# Patient Record
Sex: Female | Born: 1945 | Race: White | Hispanic: No | Marital: Married | Smoking: Never smoker
Health system: Southern US, Community
[De-identification: ages and names within clinical notes are randomized; demographics above are authoritative.]

## PROBLEM LIST (undated history)

## (undated) DIAGNOSIS — G5760 Lesion of plantar nerve, unspecified lower limb: Secondary | ICD-10-CM

## (undated) DIAGNOSIS — Z9889 Other specified postprocedural states: Secondary | ICD-10-CM

## (undated) DIAGNOSIS — I10 Essential (primary) hypertension: Secondary | ICD-10-CM

## (undated) DIAGNOSIS — J329 Chronic sinusitis, unspecified: Secondary | ICD-10-CM

## (undated) DIAGNOSIS — I509 Heart failure, unspecified: Secondary | ICD-10-CM

## (undated) DIAGNOSIS — E119 Type 2 diabetes mellitus without complications: Secondary | ICD-10-CM

## (undated) DIAGNOSIS — C50919 Malignant neoplasm of unspecified site of unspecified female breast: Secondary | ICD-10-CM

## (undated) DIAGNOSIS — J45909 Unspecified asthma, uncomplicated: Secondary | ICD-10-CM

## (undated) DIAGNOSIS — E7800 Pure hypercholesterolemia, unspecified: Secondary | ICD-10-CM

## (undated) HISTORY — DX: Essential (primary) hypertension: I10

## (undated) HISTORY — DX: Pure hypercholesterolemia, unspecified: E78.00

## (undated) HISTORY — DX: Lesion of plantar nerve, unspecified lower limb: G57.60

## (undated) HISTORY — DX: Other specified postprocedural states: Z98.890

## (undated) HISTORY — DX: Malignant neoplasm of unspecified site of unspecified female breast: C50.919

## (undated) HISTORY — PX: RHINOPLASTY: SUR1284

## (undated) HISTORY — PX: TOTAL ABDOMINAL HYSTERECTOMY W/ BILATERAL SALPINGOOPHORECTOMY: SHX83

## (undated) HISTORY — DX: Chronic sinusitis, unspecified: J32.9

## (undated) HISTORY — PX: ABDOMINAL HYSTERECTOMY: SHX81

## (undated) HISTORY — DX: Unspecified asthma, uncomplicated: J45.909

---

## 1998-05-29 HISTORY — PX: FOOT NEUROMA SURGERY: SHX646

## 2010-05-29 HISTORY — PX: BREAST SURGERY: SHX581

## 2014-01-15 ENCOUNTER — Encounter (HOSPITAL_COMMUNITY): Payer: Medicare PPO | Attending: Hematology and Oncology

## 2014-01-15 ENCOUNTER — Encounter (HOSPITAL_COMMUNITY): Payer: Self-pay

## 2014-01-15 VITALS — BP 144/89 | HR 82 | Temp 97.7°F | Resp 18 | Wt 240.0 lb

## 2014-01-15 DIAGNOSIS — M949 Disorder of cartilage, unspecified: Secondary | ICD-10-CM

## 2014-01-15 DIAGNOSIS — K219 Gastro-esophageal reflux disease without esophagitis: Secondary | ICD-10-CM | POA: Insufficient documentation

## 2014-01-15 DIAGNOSIS — Z888 Allergy status to other drugs, medicaments and biological substances status: Secondary | ICD-10-CM | POA: Insufficient documentation

## 2014-01-15 DIAGNOSIS — Z170 Estrogen receptor positive status [ER+]: Secondary | ICD-10-CM | POA: Insufficient documentation

## 2014-01-15 DIAGNOSIS — M8580 Other specified disorders of bone density and structure, unspecified site: Secondary | ICD-10-CM | POA: Insufficient documentation

## 2014-01-15 DIAGNOSIS — Z9079 Acquired absence of other genital organ(s): Secondary | ICD-10-CM | POA: Insufficient documentation

## 2014-01-15 DIAGNOSIS — M899 Disorder of bone, unspecified: Secondary | ICD-10-CM | POA: Insufficient documentation

## 2014-01-15 DIAGNOSIS — Z79899 Other long term (current) drug therapy: Secondary | ICD-10-CM | POA: Insufficient documentation

## 2014-01-15 DIAGNOSIS — C50919 Malignant neoplasm of unspecified site of unspecified female breast: Secondary | ICD-10-CM | POA: Insufficient documentation

## 2014-01-15 DIAGNOSIS — Z809 Family history of malignant neoplasm, unspecified: Secondary | ICD-10-CM | POA: Insufficient documentation

## 2014-01-15 DIAGNOSIS — Z90710 Acquired absence of both cervix and uterus: Secondary | ICD-10-CM | POA: Insufficient documentation

## 2014-01-15 DIAGNOSIS — E785 Hyperlipidemia, unspecified: Secondary | ICD-10-CM | POA: Insufficient documentation

## 2014-01-15 DIAGNOSIS — E7800 Pure hypercholesterolemia, unspecified: Secondary | ICD-10-CM | POA: Insufficient documentation

## 2014-01-15 DIAGNOSIS — Z8249 Family history of ischemic heart disease and other diseases of the circulatory system: Secondary | ICD-10-CM | POA: Insufficient documentation

## 2014-01-15 DIAGNOSIS — Z882 Allergy status to sulfonamides status: Secondary | ICD-10-CM | POA: Insufficient documentation

## 2014-01-15 DIAGNOSIS — Z9889 Other specified postprocedural states: Secondary | ICD-10-CM | POA: Insufficient documentation

## 2014-01-15 DIAGNOSIS — G5760 Lesion of plantar nerve, unspecified lower limb: Secondary | ICD-10-CM | POA: Insufficient documentation

## 2014-01-15 DIAGNOSIS — I10 Essential (primary) hypertension: Secondary | ICD-10-CM | POA: Insufficient documentation

## 2014-01-15 HISTORY — DX: Malignant neoplasm of unspecified site of unspecified female breast: C50.919

## 2014-01-15 NOTE — Patient Instructions (Signed)
[place] Discharge Instructions  RECOMMENDATIONS MADE BY THE CONSULTANT AND ANY TEST RESULTS WILL BE SENT TO YOUR REFERRING PHYSICIAN.  Return in 6 months for lab work and office visit.  Thank you for choosing [facility] to provide your oncology and hematology care.  To afford each patient quality time with our providers, please arrive at least 15 minutes before your scheduled appointment time.  With your help, our goal is to use those 15 minutes to complete the necessary work-up to ensure our physicians have the information they need to help with your evaluation and healthcare recommendations.    Effective January 1st, 2014, we ask that you re-schedule your appointment with our physicians should you arrive 10 or more minutes late for your appointment.  We strive to give you quality time with our providers, and arriving late affects you and other patients whose appointments are after yours.    Again, thank you for choosing [facility].  Our hope is that these requests will decrease the amount of time that you wait before being seen by our physicians.       _____________________________________________________________  Should you have questions after your visit to [facility], please contact our office at [phone] between the hours of 8:30 a.m. and 4:30 p.m.  Voicemails left after 4:30 p.m. will not be returned until the following business day.  For prescription refill requests, have your pharmacy contact our office with your prescription refill request.    _______________________________________________________________  We hope that we have given you very good care.  You may receive a patient satisfaction survey in the mail, please complete it and return it as soon as possible.  We value your feedback!  _______________________________________________________________  Have you asked about our STAR program?  STAR stands for  Survivorship Training and Rehabilitation, and this is a nationally recognized cancer care program that focuses on survivorship and rehabilitation.  Cancer and cancer treatments may cause problems, such as, pain, making you feel tired and keeping you from doing the things that you need or want to do. Cancer rehabilitation can help. Our goal is to reduce these troubling effects and help you have the best quality of life possible.  You may receive a survey from a nurse that asks questions about your current state of health.  Based on the survey results, all eligible patients will be referred to the [facility] program for an evaluation so we can better serve you!  A frequently asked questions sheet is available upon request.

## 2014-01-15 NOTE — Progress Notes (Signed)
g       [place] A. Barnet Glasgow, M.D.  NEW PATIENT EVALUATION   Name: Catherine Stanley Date: 01/16/2014 MRN: [id number] DOB: 30-Apr-1946  PCP: [name], MD   REFERRING PHYSICIAN: [name], MD  REASON FOR REFERRAL: Followup of breast cancer, previously treated in [place].     HISTORY OF PRESENT ILLNESS:Catherine Stanley is a 68 y.o. female who is referred by her family physician for followup of breast cancer, originally treated in [place]. Path report is not available from her oncologist in [place] but hospital will be contacted for a copy of the path report. Apparently she had invasive cancer on the left and noninvasive disease on the right, status post bilateral lumpectomy with sentinel node biopsy on the left and bilateral external beam radiotherapy for an ER positive breast cancer. She was treated with letrozole which was started in August of 2013 with surgery on 09/06/2011. She moved here from [place] about 4 months ago with her husband. She denies any bilateral lymphedema and has had normal self breast examination. She denies any nasal drip, headache, skin rash, worsening joint pains but does have hot flashes but typically at night. She denies any vaginal dryness, incontinence, lower extremity swelling or redness, PND, orthopnea, palpitations, skin rash, headache, or seizures.   PAST MEDICAL HISTORY:  has a past medical history of Breast cancer; Hypertension; High cholesterol; Morton's neuroma; and H/O rhinoplasty.     PAST SURGICAL HISTORY: Past Surgical History  Procedure Laterality Date  . Abdominal hysterectomy    . Total abdominal hysterectomy w/ bilateral salpingoophorectomy    . Rhinoplasty      fx nasal septum     CURRENT MEDICATIONS: has a current medication list which includes the following prescription(s): alendronate, calcium carbonate, colesevelam, furosemide, glucosamine-chondroitin, lansoprazole, letrozole, multivitamin with  minerals, potassium chloride, and verapamil hcl.   ALLERGIES: Sulfa antibiotics; Aspirin; and Other   SOCIAL HISTORY:  reports that she has never smoked. She does not have any smokeless tobacco history on file. She reports that she drinks about .6 ounces of alcohol per week. She reports that she does not use illicit drugs. 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   FAMILY HISTORY: family history includes Cancer in her father, maternal aunt, mother, and sister; Diabetes in her mother; Hypertension in her mother.    REVIEW OF SYSTEMS:  Other than that discussed above is noncontributory.    PHYSICAL EXAM:  weight is 240 lb (108.863 kg). Her oral temperature is 97.7 F (36.5 C). Her blood pressure is 144/89 and her pulse is 82. Her respiration is 18.    GENERAL:alert, no distress and comfortable. Morbidly obese. SKIN: skin color, texture, turgor are normal, no rashes or significant lesions EYES: normal, Conjunctiva are pink and non-injected, sclera clear OROPHARYNX:no exudate, no erythema and lips, buccal mucosa, and tongue normal  NECK: supple, thyroid normal size, non-tender, without nodularity CHEST: Status post bilateral breast lumpectomies with no masses. LYMPH:  no palpable lymphadenopathy in the cervical, axillary or inguinal LUNGS: clear to auscultation and percussion with normal breathing effort HEART: regular rate & rhythm and no murmurs ABDOMEN:abdomen soft, non-tender and normal bowel sounds MUSCULOSKELETALl:no cyanosis of digits, no clubbing or edema  NEURO: alert & oriented x 3 with fluent speech, no focal motor/sensory deficits    LABORATORY DATA:  Office Visit on 01/15/2014  Component Date Value Ref Range Status  . WBC 01/16/2014 9.1  4.0 - 10.5 K/uL Final  .  RBC 01/16/2014 4.32  3.87 - 5.11 MIL/uL Final  . Hemoglobin 01/16/2014 14.4  12.0 - 15.0 g/dL Final  . HCT 01/16/2014 42.9  36.0 - 46.0 % Final  . MCV 01/16/2014  99.3  78.0 - 100.0 fL Final  . MCH 01/16/2014 33.3  26.0 - 34.0 pg Final  . MCHC 01/16/2014 33.6  30.0 - 36.0 g/dL Final  . RDW 01/16/2014 14.2  11.5 - 15.5 % Final  . Platelets 01/16/2014 232  150 - 400 K/uL Final  . Neutrophils Relative % 01/16/2014 65  43 - 77 % Final  . Neutro Abs 01/16/2014 5.9  1.7 - 7.7 K/uL Final  . Lymphocytes Relative 01/16/2014 26  12 - 46 % Final  . Lymphs Abs 01/16/2014 2.4  0.7 - 4.0 K/uL Final  . Monocytes Relative 01/16/2014 7  3 - 12 % Final  . Monocytes Absolute 01/16/2014 0.6  0.1 - 1.0 K/uL Final  . Eosinophils Relative 01/16/2014 2  0 - 5 % Final  . Eosinophils Absolute 01/16/2014 0.2  0.0 - 0.7 K/uL Final  . Basophils Relative 01/16/2014 0  0 - 1 % Final  . Basophils Absolute 01/16/2014 0.0  0.0 - 0.1 K/uL Final  . Sodium 01/16/2014 140  137 - 147 mEq/L Final  . Potassium 01/16/2014 4.0  3.7 - 5.3 mEq/L Final  . Chloride 01/16/2014 100  96 - 112 mEq/L Final  . CO2 01/16/2014 25  19 - 32 mEq/L Final  . Glucose, Bld 01/16/2014 199* 70 - 99 mg/dL Final  . BUN 01/16/2014 10  6 - 23 mg/dL Final  . Creatinine, Ser 01/16/2014 0.58  0.50 - 1.10 mg/dL Final  . Calcium 01/16/2014 9.2  8.4 - 10.5 mg/dL Final  . Total Protein 01/16/2014 7.2  6.0 - 8.3 g/dL Final  . Albumin 01/16/2014 3.3* 3.5 - 5.2 g/dL Final  . AST 01/16/2014 44* 0 - 37 U/L Final  . ALT 01/16/2014 45* 0 - 35 U/L Final  . Alkaline Phosphatase 01/16/2014 103  39 - 117 U/L Final  . Total Bilirubin 01/16/2014 0.9  0.3 - 1.2 mg/dL Final  . GFR calc non Af Amer 01/16/2014 >90  >90 mL/min Final  . GFR calc Af Amer 01/16/2014 >90  >90 mL/min Final   Comment: (NOTE)                          The eGFR has been calculated using the CKD EPI equation.                          This calculation has not been validated in all clinical situations.                          eGFR's persistently <90 mL/min signify possible Chronic Kidney                          Disease.  . Anion gap 01/16/2014 15  5 - 15  Final    Urinalysis No results found for this basename: colorurine,  appearanceur,  labspec,  phurine,  glucoseu,  hgbur,  bilirubinur,  ketonesur,  proteinur,  urobilinogen,  nitrite,  leukocytesur      @RADIOGRAPHY : No results found.  PATHOLOGY:  08/31/2011: MRI guided right breast biopsy consistent with focal lobular carcinoma in situ. 09/06/2011: Bilateral breast lumpectomy with sentinel node biopsy revealing  invasive lobular carcinoma in the left breast, size unknown, negative lymph nodes, Oncotype DX score of 11, ER/PR positive, HER-2/neu not overexpressed.  IMPRESSION:  #1. Invasive left breast cancer, status post lumpectomy and radiotherapy,  ER positive, tolerating letrozole well since started in August 2013, surgery performed on 09/06/2011. #2. 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 #3. Osteopenia, on treatment. #4. Gastroesophageal reflux disease, on treatment. #5. Hypertension, controlled. #6. Hyperlipidemia   PLAN:  #1. Continue letrozole 2.5 mg daily.  #2. Continue current medications. #3. Bilateral diagnostic mammography plus DEXA scan. Patient does have CTs of her previous MRIs of the breast and mammograms. #4. Followup in 6 months with CBC, chem profile, CEA, CA 27-29, and vitamin D level.    Doroteo Bradford, MD 01/16/2014 10:58 AM   DISCLAIMER:  This note was dictated with voice recognition softwre.  Similar sounding words can inadvertently be transcribed inaccurately and may not be corrected upon review.

## 2014-01-16 ENCOUNTER — Encounter (HOSPITAL_BASED_OUTPATIENT_CLINIC_OR_DEPARTMENT_OTHER): Payer: Medicare PPO

## 2014-01-16 DIAGNOSIS — Z79899 Other long term (current) drug therapy: Secondary | ICD-10-CM | POA: Diagnosis not present

## 2014-01-16 DIAGNOSIS — I10 Essential (primary) hypertension: Secondary | ICD-10-CM | POA: Diagnosis not present

## 2014-01-16 DIAGNOSIS — M949 Disorder of cartilage, unspecified: Secondary | ICD-10-CM | POA: Diagnosis not present

## 2014-01-16 DIAGNOSIS — Z888 Allergy status to other drugs, medicaments and biological substances status: Secondary | ICD-10-CM | POA: Diagnosis not present

## 2014-01-16 DIAGNOSIS — Z809 Family history of malignant neoplasm, unspecified: Secondary | ICD-10-CM | POA: Diagnosis not present

## 2014-01-16 DIAGNOSIS — Z882 Allergy status to sulfonamides status: Secondary | ICD-10-CM | POA: Diagnosis not present

## 2014-01-16 DIAGNOSIS — Z90710 Acquired absence of both cervix and uterus: Secondary | ICD-10-CM | POA: Diagnosis not present

## 2014-01-16 DIAGNOSIS — C50919 Malignant neoplasm of unspecified site of unspecified female breast: Secondary | ICD-10-CM | POA: Diagnosis present

## 2014-01-16 DIAGNOSIS — E7800 Pure hypercholesterolemia, unspecified: Secondary | ICD-10-CM | POA: Diagnosis not present

## 2014-01-16 DIAGNOSIS — G5760 Lesion of plantar nerve, unspecified lower limb: Secondary | ICD-10-CM | POA: Diagnosis not present

## 2014-01-16 DIAGNOSIS — Z9079 Acquired absence of other genital organ(s): Secondary | ICD-10-CM | POA: Diagnosis not present

## 2014-01-16 DIAGNOSIS — Z8249 Family history of ischemic heart disease and other diseases of the circulatory system: Secondary | ICD-10-CM | POA: Diagnosis not present

## 2014-01-16 DIAGNOSIS — Z9889 Other specified postprocedural states: Secondary | ICD-10-CM | POA: Diagnosis not present

## 2014-01-16 DIAGNOSIS — Z170 Estrogen receptor positive status [ER+]: Secondary | ICD-10-CM | POA: Diagnosis not present

## 2014-01-16 DIAGNOSIS — E785 Hyperlipidemia, unspecified: Secondary | ICD-10-CM | POA: Diagnosis not present

## 2014-01-16 DIAGNOSIS — K219 Gastro-esophageal reflux disease without esophagitis: Secondary | ICD-10-CM | POA: Diagnosis not present

## 2014-01-16 DIAGNOSIS — M899 Disorder of bone, unspecified: Secondary | ICD-10-CM | POA: Diagnosis not present

## 2014-01-16 LAB — COMPREHENSIVE METABOLIC PANEL
ALK PHOS: 103 U/L (ref 39–117)
ALT: 45 U/L — AB (ref 0–35)
AST: 44 U/L — ABNORMAL HIGH (ref 0–37)
Albumin: 3.3 g/dL — ABNORMAL LOW (ref 3.5–5.2)
Anion gap: 15 (ref 5–15)
BUN: 10 mg/dL (ref 6–23)
CO2: 25 meq/L (ref 19–32)
Calcium: 9.2 mg/dL (ref 8.4–10.5)
Chloride: 100 mEq/L (ref 96–112)
Creatinine, Ser: 0.58 mg/dL (ref 0.50–1.10)
GFR calc Af Amer: >90 mL/min (ref >90)
GLUCOSE: 199 mg/dL — AB (ref 70–99)
POTASSIUM: 4 meq/L (ref 3.7–5.3)
SODIUM: 140 meq/L (ref 137–147)
Total Bilirubin: 0.9 mg/dL (ref 0.3–1.2)
Total Protein: 7.2 g/dL (ref 6.0–8.3)

## 2014-01-16 LAB — CBC WITH DIFFERENTIAL/PLATELET
Basophils Absolute: 0 10*3/uL (ref 0.0–0.1)
Basophils Relative: 0 % (ref 0–1)
Eosinophils Absolute: 0.2 10*3/uL (ref 0.0–0.7)
Eosinophils Relative: 2 % (ref 0–5)
HCT: 42.9 % (ref 36.0–46.0)
HEMOGLOBIN: 14.4 g/dL (ref 12.0–15.0)
Lymphocytes Relative: 26 % (ref 12–46)
Lymphs Abs: 2.4 10*3/uL (ref 0.7–4.0)
MCH: 33.3 pg (ref 26.0–34.0)
MCHC: 33.6 g/dL (ref 30.0–36.0)
MCV: 99.3 fL (ref 78.0–100.0)
MONOS PCT: 7 % (ref 3–12)
Monocytes Absolute: 0.6 10*3/uL (ref 0.1–1.0)
NEUTROS ABS: 5.9 10*3/uL (ref 1.7–7.7)
NEUTROS PCT: 65 % (ref 43–77)
Platelets: 232 10*3/uL (ref 150–400)
RBC: 4.32 MIL/uL (ref 3.87–5.11)
RDW: 14.2 % (ref 11.5–15.5)
WBC: 9.1 10*3/uL (ref 4.0–10.5)

## 2014-01-16 NOTE — Progress Notes (Signed)
LABS DRAWN FOR [id number],CBCD,CEA,CMP,VD25

## 2014-01-17 LAB — CEA: CEA: 1.1 ng/mL (ref 0.0–5.0)

## 2014-01-17 LAB — CANCER ANTIGEN 27.29: CA 27.29: 41 U/mL — AB (ref 0–39)

## 2014-01-17 LAB — VITAMIN D 25 HYDROXY (VIT D DEFICIENCY, FRACTURES): Vit D, 25-Hydroxy: 56 ng/mL (ref 30–89)

## 2014-01-28 ENCOUNTER — Ambulatory Visit (HOSPITAL_COMMUNITY)
Admission: RE | Admit: 2014-01-28 | Discharge: 2014-01-28 | Disposition: A | Discharge status: Home / Self Care | Payer: Medicare PPO | Source: Ambulatory Visit | Attending: Hematology and Oncology | Admitting: Hematology and Oncology

## 2014-01-28 DIAGNOSIS — M899 Disorder of bone, unspecified: Secondary | ICD-10-CM | POA: Diagnosis not present

## 2014-01-28 DIAGNOSIS — M8580 Other specified disorders of bone density and structure, unspecified site: Secondary | ICD-10-CM

## 2014-01-28 DIAGNOSIS — M949 Disorder of cartilage, unspecified: Principal | ICD-10-CM

## 2014-02-03 ENCOUNTER — Encounter (HOSPITAL_COMMUNITY): Payer: Medicare PPO

## 2014-02-17 ENCOUNTER — Other Ambulatory Visit (HOSPITAL_COMMUNITY): Payer: Self-pay | Admitting: Hematology and Oncology

## 2014-02-17 ENCOUNTER — Ambulatory Visit (HOSPITAL_COMMUNITY)
Admission: RE | Admit: 2014-02-17 | Discharge: 2014-02-17 | Disposition: A | Discharge status: Home / Self Care | Payer: Medicare PPO | Source: Ambulatory Visit | Attending: Hematology and Oncology | Admitting: Hematology and Oncology

## 2014-02-17 DIAGNOSIS — C50919 Malignant neoplasm of unspecified site of unspecified female breast: Secondary | ICD-10-CM | POA: Diagnosis not present

## 2014-02-17 DIAGNOSIS — Z9889 Other specified postprocedural states: Secondary | ICD-10-CM | POA: Diagnosis not present

## 2014-06-15 ENCOUNTER — Other Ambulatory Visit (HOSPITAL_COMMUNITY): Payer: Self-pay | Admitting: Internal Medicine

## 2014-06-15 DIAGNOSIS — J339 Nasal polyp, unspecified: Secondary | ICD-10-CM

## 2014-06-18 ENCOUNTER — Ambulatory Visit (HOSPITAL_COMMUNITY)
Admission: RE | Admit: 2014-06-18 | Discharge: 2014-06-18 | Disposition: A | Discharge status: Home / Self Care | Payer: PPO | Source: Ambulatory Visit | Attending: Internal Medicine | Admitting: Internal Medicine

## 2014-06-18 DIAGNOSIS — J339 Nasal polyp, unspecified: Secondary | ICD-10-CM | POA: Diagnosis present

## 2014-07-15 ENCOUNTER — Ambulatory Visit (HOSPITAL_COMMUNITY): Payer: PPO | Admitting: Hematology & Oncology

## 2014-07-15 ENCOUNTER — Other Ambulatory Visit (HOSPITAL_COMMUNITY): Payer: Medicare PPO

## 2014-07-16 ENCOUNTER — Other Ambulatory Visit (HOSPITAL_COMMUNITY): Payer: Self-pay | Admitting: Hematology & Oncology

## 2014-07-16 DIAGNOSIS — Z09 Encounter for follow-up examination after completed treatment for conditions other than malignant neoplasm: Secondary | ICD-10-CM

## 2014-07-28 ENCOUNTER — Other Ambulatory Visit (HOSPITAL_COMMUNITY): Payer: Self-pay

## 2014-07-28 DIAGNOSIS — C50919 Malignant neoplasm of unspecified site of unspecified female breast: Secondary | ICD-10-CM

## 2014-07-28 DIAGNOSIS — M8580 Other specified disorders of bone density and structure, unspecified site: Secondary | ICD-10-CM

## 2014-07-30 ENCOUNTER — Encounter (HOSPITAL_COMMUNITY): Payer: PPO | Attending: Hematology & Oncology

## 2014-07-30 DIAGNOSIS — D72829 Elevated white blood cell count, unspecified: Secondary | ICD-10-CM | POA: Insufficient documentation

## 2014-07-30 DIAGNOSIS — M8580 Other specified disorders of bone density and structure, unspecified site: Secondary | ICD-10-CM | POA: Insufficient documentation

## 2014-07-30 DIAGNOSIS — C50919 Malignant neoplasm of unspecified site of unspecified female breast: Secondary | ICD-10-CM | POA: Diagnosis present

## 2014-07-30 DIAGNOSIS — Z853 Personal history of malignant neoplasm of breast: Secondary | ICD-10-CM

## 2014-07-30 LAB — COMPREHENSIVE METABOLIC PANEL
ALT: 45 U/L — AB (ref 0–35)
AST: 32 U/L (ref 0–37)
Albumin: 3.9 g/dL (ref 3.5–5.2)
Alkaline Phosphatase: 84 U/L (ref 39–117)
Anion gap: 8 (ref 5–15)
BILIRUBIN TOTAL: 0.8 mg/dL (ref 0.3–1.2)
BUN: 15 mg/dL (ref 6–23)
CO2: 26 mmol/L (ref 19–32)
CREATININE: 0.61 mg/dL (ref 0.50–1.10)
Calcium: 9.2 mg/dL (ref 8.4–10.5)
Chloride: 106 mmol/L (ref 96–112)
GFR calc Af Amer: >90 mL/min (ref >90)
GFR calc non Af Amer: >90 mL/min (ref >90)
Glucose, Bld: 171 mg/dL — ABNORMAL HIGH (ref 70–99)
Potassium: 3.9 mmol/L (ref 3.5–5.1)
Sodium: 140 mmol/L (ref 135–145)
Total Protein: 7.3 g/dL (ref 6.0–8.3)

## 2014-07-30 LAB — CBC WITH DIFFERENTIAL/PLATELET
Basophils Absolute: 0 10*3/uL (ref 0.0–0.1)
Basophils Relative: 0 % (ref 0–1)
EOS ABS: 0 10*3/uL (ref 0.0–0.7)
EOS PCT: 0 % (ref 0–5)
HEMATOCRIT: 43.5 % (ref 36.0–46.0)
HEMOGLOBIN: 14.5 g/dL (ref 12.0–15.0)
Lymphocytes Relative: 15 % (ref 12–46)
Lymphs Abs: 2.4 10*3/uL (ref 0.7–4.0)
MCH: 31.9 pg (ref 26.0–34.0)
MCHC: 33.3 g/dL (ref 30.0–36.0)
MCV: 95.8 fL (ref 78.0–100.0)
MONO ABS: 0.8 10*3/uL (ref 0.1–1.0)
MONOS PCT: 5 % (ref 3–12)
NEUTROS ABS: 13.1 10*3/uL — AB (ref 1.7–7.7)
Neutrophils Relative %: 80 % — ABNORMAL HIGH (ref 43–77)
Platelets: 267 10*3/uL (ref 150–400)
RBC: 4.54 MIL/uL (ref 3.87–5.11)
RDW: 12.4 % (ref 11.5–15.5)
WBC: 16.3 10*3/uL — ABNORMAL HIGH (ref 4.0–10.5)

## 2014-07-30 NOTE — Progress Notes (Signed)
LABS FOR VD25,[id number],CBCD,CMP

## 2014-07-31 ENCOUNTER — Encounter (HOSPITAL_COMMUNITY): Payer: Self-pay | Admitting: Oncology

## 2014-07-31 ENCOUNTER — Encounter (HOSPITAL_BASED_OUTPATIENT_CLINIC_OR_DEPARTMENT_OTHER): Payer: PPO | Admitting: Oncology

## 2014-07-31 VITALS — BP 127/72 | HR 69 | Temp 97.6°F | Resp 18 | Wt 266.3 lb

## 2014-07-31 DIAGNOSIS — D72829 Elevated white blood cell count, unspecified: Secondary | ICD-10-CM

## 2014-07-31 DIAGNOSIS — Z79811 Long term (current) use of aromatase inhibitors: Secondary | ICD-10-CM

## 2014-07-31 DIAGNOSIS — Z170 Estrogen receptor positive status [ER+]: Secondary | ICD-10-CM

## 2014-07-31 DIAGNOSIS — C50919 Malignant neoplasm of unspecified site of unspecified female breast: Secondary | ICD-10-CM

## 2014-07-31 DIAGNOSIS — C50912 Malignant neoplasm of unspecified site of left female breast: Secondary | ICD-10-CM

## 2014-07-31 DIAGNOSIS — M8580 Other specified disorders of bone density and structure, unspecified site: Secondary | ICD-10-CM

## 2014-07-31 LAB — VITAMIN D 25 HYDROXY (VIT D DEFICIENCY, FRACTURES): Vit D, 25-Hydroxy: 31.7 ng/mL (ref 30.0–100.0)

## 2014-07-31 LAB — CANCER ANTIGEN 27.29: CA 27.29: 38.4 U/mL (ref 0.0–38.6)

## 2014-07-31 NOTE — Assessment & Plan Note (Addendum)
New.  Likely secondary from Prednisone, which she is currently on. Will monitor.  Repeat CBC in 6 months.

## 2014-07-31 NOTE — Progress Notes (Signed)
Catherine Cahill, MD  [place] [address]  Primary invasive malignant neoplasm of female breast, unspecified laterality - Plan: CBC with Differential, Comprehensive metabolic panel  Osteopenia  Leukocytosis  CURRENT THERAPY: Letrozole beginning in August 2013 in [place].  INTERVAL HISTORY: Catherine Stanley 69 y.o. 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 She was treated with letrozole which was started in August of 2013 with surgery on 09/06/2011.   I personally reviewed and went over laboratory results with the patient.  The results are noted within this dictation.  I personally reviewed and went over radiographic studies with the patient.  The results are noted within this dictation.  She underwent bilateral US of breasts, ordered by Dr. Barnet Glasgow, on 02/17/2014.  It was recommended for bilateral diagnostic mammogram 6 months later and this is scheduled for April 2016.   She reports that in Jan 2016, she was diagnosed with a sinus infection.  This was treated by her primary care provider.  Part of the treatment included a course of Prednisone.  Subsequently, she was seen by Dr. Benjamine Mola (ENT) who noted a repeat sinus infection with polyps.  She was therefore restarted on Prednisone.  She is currently on Prednisone.  This is likely the cause of her leukocytosis noted on recent labs.  Oncologically, she denies any complaints and ROS questioning is negative.  Past Medical History  Diagnosis Date  . Breast cancer   . Hypertension   . High cholesterol   . Morton's neuroma   . H/O rhinoplasty   . Invasive lobular carcinoma of left breast 01/15/2014    In [place]: 08/31/2011: MRI guided right breast biopsy consistent with focal lobular  carcinoma in situ. 09/06/2011: Bilateral breast lumpectomy with sentinel node biopsy revealing invasive lobular carcinoma in the left breast, size unknown, negative lymph nodes, Oncotype DX score of 11, ER/PR positive, HER-2/neu not overexpressed.   . Asthma in adult   . Sinus infection     has Invasive lobular carcinoma of left breast; Hypertension; Osteopenia; Gastroesophageal reflux disease; Hyperlipidemia; and Leukocytosis on her problem list.     is allergic to sulfa antibiotics; aspirin; other; and seldane.  Catherine Stanley does not currently have medications on file.  Past Surgical History  Procedure Laterality Date  . Abdominal hysterectomy    . Total abdominal hysterectomy w/ bilateral salpingoophorectomy    . Rhinoplasty      fx nasal septum    Denies any headaches, dizziness, double vision, fevers, chills, night sweats, nausea, vomiting, diarrhea, constipation, chest pain, heart palpitations, shortness of breath, blood in stool, black tarry stool, urinary pain, urinary burning, urinary frequency, hematuria.   PHYSICAL EXAMINATION  ECOG PERFORMANCE STATUS: 0 - Asymptomatic  Filed Vitals:   07/31/14 1142  BP: 127/72  Pulse: 69  Temp: 97.6 F (36.4 C)  Resp: 18    GENERAL:alert, no distress, well nourished, well developed, comfortable, cooperative, obese and smiling, accompanied by her husband. SKIN: skin color, texture, turgor are normal, no rashes or significant lesions HEAD: Normocephalic, No masses, lesions, tenderness or abnormalities EYES: normal, PERRLA, EOMI, Conjunctiva are pink and non-injected EARS: External ears normal OROPHARYNX:lips, buccal mucosa, and tongue normal and mucous membranes are moist  NECK: supple, no adenopathy, thyroid normal  size, non-tender, without nodularity, no stridor, non-tender, trachea midline LYMPH:  no palpable lymphadenopathy, no hepatosplenomegaly BREAST:bilateral post-lumpectomy sites well healed and free of suspicious  changes LUNGS: clear to auscultation and percussion HEART: regular rate & rhythm, no murmurs, no gallops, S1 normal and S2 normal ABDOMEN:abdomen soft, non-tender, obese, normal bowel sounds and no masses or organomegaly BACK: Back symmetric, no curvature. EXTREMITIES:less then 2 second capillary refill, no joint deformities, effusion, or inflammation, no skin discoloration, no clubbing, no cyanosis  NEURO: alert & oriented x 3 with fluent speech, no focal motor/sensory deficits, gait normal   LABORATORY DATA: CBC    Component Value Date/Time   WBC 16.3* 07/30/2014 1150   RBC 4.54 07/30/2014 1150   HGB 14.5 07/30/2014 1150   HCT 43.5 07/30/2014 1150   PLT 267 07/30/2014 1150   MCV 95.8 07/30/2014 1150   MCH 31.9 07/30/2014 1150   MCHC 33.3 07/30/2014 1150   RDW 12.4 07/30/2014 1150   LYMPHSABS 2.4 07/30/2014 1150   MONOABS 0.8 07/30/2014 1150   EOSABS 0.0 07/30/2014 1150   BASOSABS 0.0 07/30/2014 1150      Chemistry      Component Value Date/Time   NA 140 07/30/2014 1150   K 3.9 07/30/2014 1150   CL 106 07/30/2014 1150   CO2 26 07/30/2014 1150   BUN 15 07/30/2014 1150   CREATININE 0.61 07/30/2014 1150      Component Value Date/Time   CALCIUM 9.2 07/30/2014 1150   ALKPHOS 84 07/30/2014 1150   AST 32 07/30/2014 1150   ALT 45* 07/30/2014 1150   BILITOT 0.8 07/30/2014 1150       RADIOGRAPHIC STUDIES:  02/17/2014   CLINICAL DATA: 69 year old female with bilateral breast cancer in 2013, in which the patient describes DCIS in the right breast and invasive carcinoma in the left breast. Post bilateral lumpectomies, left breast sentinel node biopsy, and radiation therapy. The patient states she did have a biopsy of a a nodule in the right breast, and which she was told it was a small seroma related to her surgery.  The patient states she did have mammograms in 2014, however they are not available.  EXAM: DIGITAL DIAGNOSTIC BILATERAL MAMMOGRAM WITH  CAD  RIGHT BREAST ULTRASOUND  COMPARISON: Prior mammogram dated 03/29/2012. Films from 2014 were not made available.  ACR Breast Density Category b: There are scattered areas of fibroglandular density.  FINDINGS: There is density and distortion seen in both the upper-outer right breast and upper outer left breast compatible with postlumpectomy scarring. A spot compression tangential view of the lumpectomy site in the right breast was performed demonstrating three small nodules located posterior to the surgical site, with the more posterior nodule containing a biopsy clip, and with the 2 more anterior nodules each measuring approximately 5 mm.  A spot compression tangential view of the lumpectomy site in the left breast was performed demonstrating 2 areas of nodularity along the posterior and superior margin of the lumpectomy scar, with the more posterior oval circumscribed nodule measuring 8 mm and the more superior circumscribed oval nodule measuring 4 mm.  Mammographic images were processed with CAD.  Physical examination of the upper-outer right breast reveals a postsurgical scar at the approximate 10 to 11 o'clock position. No palpable masses. Physical examination of the upper-outer left breast reveals a postsurgical scar in the far upper outer posterior left breast with a mild area of generalized thickening subjacent to the surgical scar.  Targeted ultrasound of the right breast was performed  demonstrating an oval mixed echogenicity mass at 10 o'clock 5 cm from nipple measuring 1 x 0.7 x 1.2 cm. Biopsy clip is seen along the superior margin of this mass. No additional findings are seen in the upper-outer right breast.  Targeted ultrasound of the upper-outer left breast was performed demonstrating a postsurgical scar at 10 o'clock 9 cm from the nipple. No suspicious masses or abnormalities are seen near the surgical site in the upper-outer left  breast.  IMPRESSION: Probably benign findings in the bilateral breasts, likely postsurgical changes related to prior lumpectomies.  RECOMMENDATION: Six-month followup diagnostic mammography of the bilateral breasts is recommended to demonstrate stability of the probably benign findings in the bilateral breasts likely related to postsurgical change.  I have discussed the findings and recommendations with the patient. Results were also provided in writing at the conclusion of the visit. If applicable, a reminder letter will be sent to the patient regarding the next appointment.  BI-RADS CATEGORY 3: Probably benign.   Electronically Signed  By: Everlean Alstrom M.D.  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 She is now on letrozole beginning in August 2013. Bilateral diagnostic mammogram scheduled for April 2016.  Continue Letrozole daily.  Return in 6 months for follow-up.   Osteopenia On Calcium 600 mg and Fosamax weekly.  Will change calcium to a calcium 1200 mg and Vit D [phone] units daily.  She may need to supplement her regimen to reflect these increased doses of Ca++ and Vit D.  Continue Fosamax.  If bone density worsens with next bone density test, I would recommend/consider Prolia therapy.   Leukocytosis New.  Likely secondary from Prednisone, which she is currently on. Will monitor.  Repeat CBC in 6 months.    THERAPY PLAN:  NCCN guidelines recommends the following surveillance for invasive breast cancer:  A. History and Physical exam every 4-6 months for 5 years and then  every 12 months.  B. Mammography every 12 months  C. Women on Tamoxifen: annual gynecologic assessment every 12 months if uterus is present.  D. Women on aromatase inhibitor or who experience ovarian failure secondary to treatment should have monitoring of bone health with a bone mineral density determination at baseline and periodically thereafter.  E. Assess and encourage adherence to adjuvant endocrine therapy.  F. Evidence suggests that active lifestyle and achieving and maintaining an ideal body weight (20-25 BMI) may lead to optimal breast cancer outcomes.   All questions were answered. The patient knows to call the clinic with any problems, questions or concerns. We can certainly see the patient much sooner if necessary.  Patient and plan discussed with Dr. Ancil Linsey and she is in agreement with the aforementioned.   This note is electronically signed by: Robynn Pane 07/31/2014 12:28 PM

## 2014-07-31 NOTE — Assessment & Plan Note (Addendum)
On Calcium 600 mg and Fosamax weekly.  Will change calcium to a calcium 1200 mg and Vit D [phone] units daily.  She may need to supplement her regimen to reflect these increased doses of Ca++ and Vit D.  Continue Fosamax.  If bone density worsens with next bone density test, I would recommend/consider Prolia therapy.

## 2014-07-31 NOTE — Assessment & Plan Note (Signed)
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 She is now on letrozole beginning in August 2013. Bilateral diagnostic mammogram scheduled for April 2016.  Continue Letrozole daily.  Return in 6 months for follow-up.

## 2014-07-31 NOTE — Patient Instructions (Signed)
[place] at [facility] Discharge Instructions  RECOMMENDATIONS MADE BY THE CONSULTANT AND ANY TEST RESULTS WILL BE SENT TO YOUR REFERRING PHYSICIAN.  Exam and discussion by Robynn Pane , PA-C.  You are doing well. Report any new lumps, bone pain, shortness of breath or other symptoms. Need to make sure you are taking Calcium 1000 to 1200 mg daily and Vitamin D 800 to 1000 units daily to protect your bones.  Mammogram as scheduled Labs and office visit in 6 months.  Thank you for choosing [place] at [facility] to provide your oncology and hematology care.  To afford each patient quality time with our provider, please arrive at least 15 minutes before your scheduled appointment time.    You need to re-schedule your appointment should you arrive 10 or more minutes late.  We strive to give you quality time with our providers, and arriving late affects you and other patients whose appointments are after yours.  Also, if you no show three or more times for appointments you may be dismissed from the clinic at the providers discretion.     Again, thank you for choosing [facility].  Our hope is that these requests will decrease the amount of time that you wait before being seen by our physicians.       _____________________________________________________________  Should you have questions after your visit to [facility], please contact our office at [phone] between the hours of 8:30 a.m. and 4:30 p.m.  Voicemails left after 4:30 p.m. will not be returned until the following business day.  For prescription refill requests, have your pharmacy contact our office.

## 2014-08-18 ENCOUNTER — Other Ambulatory Visit (HOSPITAL_COMMUNITY): Payer: Self-pay | Admitting: Internal Medicine

## 2014-08-18 ENCOUNTER — Ambulatory Visit (HOSPITAL_COMMUNITY)
Admission: RE | Admit: 2014-08-18 | Discharge: 2014-08-18 | Disposition: A | Discharge status: Home / Self Care | Payer: PPO | Source: Ambulatory Visit | Attending: Internal Medicine | Admitting: Internal Medicine

## 2014-08-18 ENCOUNTER — Ambulatory Visit (HOSPITAL_COMMUNITY)
Admission: RE | Admit: 2014-08-18 | Discharge: 2014-08-18 | Disposition: A | Discharge status: Home / Self Care | Payer: PPO | Source: Ambulatory Visit | Attending: Hematology & Oncology | Admitting: Hematology & Oncology

## 2014-08-18 DIAGNOSIS — IMO0002 Reserved for concepts with insufficient information to code with codable children: Secondary | ICD-10-CM

## 2014-08-18 DIAGNOSIS — R229 Localized swelling, mass and lump, unspecified: Principal | ICD-10-CM

## 2014-08-18 DIAGNOSIS — N63 Unspecified lump in breast: Secondary | ICD-10-CM | POA: Diagnosis not present

## 2014-08-18 DIAGNOSIS — N6310 Unspecified lump in the right breast, unspecified quadrant: Secondary | ICD-10-CM

## 2014-08-18 DIAGNOSIS — Z09 Encounter for follow-up examination after completed treatment for conditions other than malignant neoplasm: Secondary | ICD-10-CM

## 2014-09-01 ENCOUNTER — Encounter (HOSPITAL_COMMUNITY): Payer: PPO

## 2014-09-10 ENCOUNTER — Ambulatory Visit (INDEPENDENT_AMBULATORY_CARE_PROVIDER_SITE_OTHER): Payer: PPO | Admitting: Otolaryngology

## 2014-09-10 DIAGNOSIS — J310 Chronic rhinitis: Secondary | ICD-10-CM | POA: Diagnosis not present

## 2014-09-10 DIAGNOSIS — J330 Polyp of nasal cavity: Secondary | ICD-10-CM

## 2014-09-15 ENCOUNTER — Other Ambulatory Visit: Payer: Self-pay | Admitting: Otolaryngology

## 2014-09-21 ENCOUNTER — Other Ambulatory Visit (HOSPITAL_COMMUNITY): Payer: Self-pay | Admitting: Hematology & Oncology

## 2014-09-21 DIAGNOSIS — C50919 Malignant neoplasm of unspecified site of unspecified female breast: Secondary | ICD-10-CM

## 2014-10-14 ENCOUNTER — Encounter (HOSPITAL_BASED_OUTPATIENT_CLINIC_OR_DEPARTMENT_OTHER): Payer: Self-pay | Admitting: *Deleted

## 2014-10-14 NOTE — Progress Notes (Signed)
Patient to come for labs, EKG on Friday 5/20

## 2014-10-16 ENCOUNTER — Other Ambulatory Visit: Payer: Self-pay

## 2014-10-16 ENCOUNTER — Encounter (HOSPITAL_BASED_OUTPATIENT_CLINIC_OR_DEPARTMENT_OTHER)
Admission: RE | Admit: 2014-10-16 | Discharge: 2014-10-16 | Disposition: A | Discharge status: Home / Self Care | Payer: PPO | Source: Ambulatory Visit | Attending: Otolaryngology | Admitting: Otolaryngology

## 2014-10-16 DIAGNOSIS — J322 Chronic ethmoidal sinusitis: Secondary | ICD-10-CM | POA: Diagnosis not present

## 2014-10-16 DIAGNOSIS — I10 Essential (primary) hypertension: Secondary | ICD-10-CM | POA: Diagnosis not present

## 2014-10-16 DIAGNOSIS — K219 Gastro-esophageal reflux disease without esophagitis: Secondary | ICD-10-CM | POA: Diagnosis not present

## 2014-10-16 DIAGNOSIS — E119 Type 2 diabetes mellitus without complications: Secondary | ICD-10-CM | POA: Diagnosis not present

## 2014-10-16 DIAGNOSIS — I509 Heart failure, unspecified: Secondary | ICD-10-CM | POA: Diagnosis not present

## 2014-10-16 DIAGNOSIS — J45909 Unspecified asthma, uncomplicated: Secondary | ICD-10-CM | POA: Diagnosis not present

## 2014-10-16 LAB — BASIC METABOLIC PANEL
ANION GAP: 10 (ref 5–15)
BUN: 7 mg/dL (ref 6–20)
CHLORIDE: 109 mmol/L (ref 101–111)
CO2: 23 mmol/L (ref 22–32)
Calcium: 9.5 mg/dL (ref 8.9–10.3)
Creatinine, Ser: 0.56 mg/dL (ref 0.44–1.00)
GFR calc non Af Amer: >60 mL/min (ref >60)
Glucose, Bld: 109 mg/dL — ABNORMAL HIGH (ref 65–99)
Potassium: 4.1 mmol/L (ref 3.5–5.1)
Sodium: 142 mmol/L (ref 135–145)

## 2014-10-19 ENCOUNTER — Ambulatory Visit (HOSPITAL_BASED_OUTPATIENT_CLINIC_OR_DEPARTMENT_OTHER)
Admission: RE | Admit: 2014-10-19 | Discharge: 2014-10-19 | Disposition: A | Discharge status: Home / Self Care | Payer: PPO | Source: Ambulatory Visit | Attending: Otolaryngology | Admitting: Otolaryngology

## 2014-10-19 ENCOUNTER — Ambulatory Visit (HOSPITAL_BASED_OUTPATIENT_CLINIC_OR_DEPARTMENT_OTHER): Payer: PPO | Admitting: Anesthesiology

## 2014-10-19 ENCOUNTER — Encounter (HOSPITAL_BASED_OUTPATIENT_CLINIC_OR_DEPARTMENT_OTHER): Payer: Self-pay | Admitting: Anesthesiology

## 2014-10-19 ENCOUNTER — Encounter (HOSPITAL_BASED_OUTPATIENT_CLINIC_OR_DEPARTMENT_OTHER)
Admission: RE | Disposition: A | Discharge status: Home / Self Care | Payer: Self-pay | Source: Ambulatory Visit | Attending: Otolaryngology

## 2014-10-19 DIAGNOSIS — E119 Type 2 diabetes mellitus without complications: Secondary | ICD-10-CM | POA: Insufficient documentation

## 2014-10-19 DIAGNOSIS — J322 Chronic ethmoidal sinusitis: Secondary | ICD-10-CM

## 2014-10-19 DIAGNOSIS — I509 Heart failure, unspecified: Secondary | ICD-10-CM | POA: Insufficient documentation

## 2014-10-19 DIAGNOSIS — I10 Essential (primary) hypertension: Secondary | ICD-10-CM | POA: Diagnosis not present

## 2014-10-19 DIAGNOSIS — J338 Other polyp of sinus: Secondary | ICD-10-CM | POA: Diagnosis not present

## 2014-10-19 DIAGNOSIS — J45909 Unspecified asthma, uncomplicated: Secondary | ICD-10-CM | POA: Diagnosis not present

## 2014-10-19 DIAGNOSIS — J323 Chronic sphenoidal sinusitis: Secondary | ICD-10-CM | POA: Diagnosis not present

## 2014-10-19 DIAGNOSIS — K219 Gastro-esophageal reflux disease without esophagitis: Secondary | ICD-10-CM | POA: Insufficient documentation

## 2014-10-19 DIAGNOSIS — J320 Chronic maxillary sinusitis: Secondary | ICD-10-CM | POA: Diagnosis not present

## 2014-10-19 HISTORY — PX: SINUS ENDO WITH FUSION: SHX5329

## 2014-10-19 HISTORY — DX: Heart failure, unspecified: I50.9

## 2014-10-19 HISTORY — DX: Type 2 diabetes mellitus without complications: E11.9

## 2014-10-19 LAB — GLUCOSE, CAPILLARY
GLUCOSE-CAPILLARY: 101 mg/dL — AB (ref 65–99)
Glucose-Capillary: 120 mg/dL — ABNORMAL HIGH (ref 65–99)

## 2014-10-19 SURGERY — SURGERY, PARANASAL SINUS, ENDOSCOPIC, WITH NASAL SEPTOPLASTY, TURBINOPLASTY, AND MAXILLARY SINUSOTOMY
Anesthesia: General | Laterality: Bilateral

## 2014-10-19 MED ORDER — MIDAZOLAM HCL 2 MG/2ML IJ SOLN
INTRAMUSCULAR | Status: AC
  Filled 2014-10-19: qty 2

## 2014-10-19 MED ORDER — LACTATED RINGERS IV SOLN
INTRAVENOUS | Status: DC
  Administered 2014-10-19: 09:00:00 via INTRAVENOUS

## 2014-10-19 MED ORDER — HYDROMORPHONE HCL 1 MG/ML IJ SOLN
INTRAMUSCULAR | Status: AC
  Filled 2014-10-19: qty 1

## 2014-10-19 MED ORDER — MUPIROCIN 2 % EX OINT
TOPICAL_OINTMENT | CUTANEOUS | Status: DC | PRN
  Administered 2014-10-19: 1 via NASAL

## 2014-10-19 MED ORDER — CEFAZOLIN SODIUM-DEXTROSE 2-3 GM-% IV SOLR
INTRAVENOUS | Status: AC
  Filled 2014-10-19: qty 50

## 2014-10-19 MED ORDER — SUCCINYLCHOLINE CHLORIDE 20 MG/ML IJ SOLN
INTRAMUSCULAR | Status: DC | PRN
  Administered 2014-10-19: 100 mg via INTRAVENOUS

## 2014-10-19 MED ORDER — LIDOCAINE HCL (CARDIAC) 20 MG/ML IV SOLN
INTRAVENOUS | Status: DC | PRN
  Administered 2014-10-19: 50 mg via INTRAVENOUS

## 2014-10-19 MED ORDER — LIDOCAINE-EPINEPHRINE 1 %-1:100000 IJ SOLN
INTRAMUSCULAR | Status: DC | PRN
  Administered 2014-10-19: 2 mL

## 2014-10-19 MED ORDER — OXYMETAZOLINE HCL 0.05 % NA SOLN
NASAL | Status: DC | PRN
  Administered 2014-10-19: 1 via NASAL

## 2014-10-19 MED ORDER — HYDROMORPHONE HCL 1 MG/ML IJ SOLN
0.2500 mg | INTRAMUSCULAR | Status: DC | PRN
  Administered 2014-10-19 (×2): 0.5 mg via INTRAVENOUS

## 2014-10-19 MED ORDER — ONDANSETRON HCL 4 MG/2ML IJ SOLN
INTRAMUSCULAR | Status: DC | PRN
  Administered 2014-10-19: 4 mg via INTRAVENOUS

## 2014-10-19 MED ORDER — OXYCODONE-ACETAMINOPHEN 5-325 MG PO TABS: 1.0000 | ORAL_TABLET | ORAL | Status: DC | PRN

## 2014-10-19 MED ORDER — SODIUM CHLORIDE 0.9 % IV SOLN: INTRAVENOUS | Status: DC

## 2014-10-19 MED ORDER — PROPOFOL 10 MG/ML IV BOLUS
INTRAVENOUS | Status: DC | PRN
  Administered 2014-10-19: 40 mg via INTRAVENOUS
  Administered 2014-10-19: 200 mg via INTRAVENOUS

## 2014-10-19 MED ORDER — PROMETHAZINE HCL 25 MG/ML IJ SOLN: 6.2500 mg | INTRAMUSCULAR | Status: DC | PRN

## 2014-10-19 MED ORDER — FENTANYL CITRATE (PF) 100 MCG/2ML IJ SOLN
INTRAMUSCULAR | Status: DC | PRN
  Administered 2014-10-19 (×2): 50 ug via INTRAVENOUS
  Administered 2014-10-19 (×2): 25 ug via INTRAVENOUS

## 2014-10-19 MED ORDER — AMOXICILLIN 875 MG PO TABS: 875.0000 mg | ORAL_TABLET | Freq: Two times a day (BID) | ORAL | Status: DC

## 2014-10-19 MED ORDER — CEFAZOLIN SODIUM-DEXTROSE 2-3 GM-% IV SOLR
INTRAVENOUS | Status: DC | PRN
  Administered 2014-10-19: 2 g via INTRAVENOUS

## 2014-10-19 MED ORDER — MIDAZOLAM HCL 5 MG/5ML IJ SOLN
INTRAMUSCULAR | Status: DC | PRN
  Administered 2014-10-19: 2 mg via INTRAVENOUS

## 2014-10-19 MED ORDER — FENTANYL CITRATE (PF) 100 MCG/2ML IJ SOLN
INTRAMUSCULAR | Status: AC
  Filled 2014-10-19: qty 4

## 2014-10-19 SURGICAL SUPPLY — 44 items
BLADE RAD60 ROTATE M4 4 5PK (BLADE) ×2 IMPLANT
BLADE ROTATE RAD 12 4 M4 (BLADE) IMPLANT
BLADE ROTATE RAD 40 4 M4 (BLADE) IMPLANT
BLADE ROTATE TRICUT 4X13 M4 (BLADE) IMPLANT
BLADE TRICUT ROTATE M4 4 5PK (BLADE) ×2 IMPLANT
BUR HS RAD FRONTAL 3 (BURR) IMPLANT
CANISTER SUC SOCK COL 7IN (MISCELLANEOUS) ×4 IMPLANT
CANISTER SUCT 1200ML W/VALVE (MISCELLANEOUS) ×2 IMPLANT
COAGULATOR SUCT 8FR VV (MISCELLANEOUS) IMPLANT
COAGULATOR SUCT SWTCH 10FR 6 (ELECTROSURGICAL) ×2 IMPLANT
DECANTER SPIKE VIAL GLASS SM (MISCELLANEOUS) IMPLANT
DRSG NASAL KENNEDY LMNT 8CM (GAUZE/BANDAGES/DRESSINGS) IMPLANT
DRSG NASOPORE 8CM (GAUZE/BANDAGES/DRESSINGS) ×2 IMPLANT
ELECT REM PT RETURN 9FT ADLT (ELECTROSURGICAL) ×2
ELECTRODE REM PT RTRN 9FT ADLT (ELECTROSURGICAL) ×1 IMPLANT
GLOVE BIO SURGEON STRL SZ7.5 (GLOVE) ×2 IMPLANT
GLOVE SURG SS PI 7.0 STRL IVOR (GLOVE) ×2 IMPLANT
GOWN STRL REUS W/ TWL LRG LVL3 (GOWN DISPOSABLE) ×2 IMPLANT
GOWN STRL REUS W/TWL LRG LVL3 (GOWN DISPOSABLE) ×2
HEMOSTAT SURGICEL 2X14 (HEMOSTASIS) IMPLANT
IV NS 1000ML (IV SOLUTION)
IV NS 1000ML BAXH (IV SOLUTION) IMPLANT
IV NS 500ML (IV SOLUTION) ×1
IV NS 500ML BAXH (IV SOLUTION) ×1 IMPLANT
NEEDLE PRECISIONGLIDE 27X1.5 (NEEDLE) ×2 IMPLANT
NEEDLE SPNL 25GX3.5 QUINCKE BL (NEEDLE) IMPLANT
NS IRRIG 1000ML POUR BTL (IV SOLUTION) ×2 IMPLANT
PACK BASIN DAY SURGERY FS (CUSTOM PROCEDURE TRAY) ×2 IMPLANT
PACK ENT DAY SURGERY (CUSTOM PROCEDURE TRAY) ×2 IMPLANT
PAD ENT ADHESIVE 25PK (MISCELLANEOUS) ×4 IMPLANT
SLEEVE SCD COMPRESS KNEE MED (MISCELLANEOUS) ×2 IMPLANT
SOLUTION BUTLER CLEAR DIP (MISCELLANEOUS) ×2 IMPLANT
SPLINT NASAL AIRWAY SILICONE (MISCELLANEOUS) IMPLANT
SPONGE GAUZE 2X2 8PLY STRL LF (GAUZE/BANDAGES/DRESSINGS) ×2 IMPLANT
SPONGE NEURO XRAY DETECT 1X3 (DISPOSABLE) ×2 IMPLANT
SUT ETHILON 3 0 PS 1 (SUTURE) IMPLANT
SUT PLAIN 4 0 ~~LOC~~ 1 (SUTURE) IMPLANT
TOWEL OR 17X24 6PK STRL BLUE (TOWEL DISPOSABLE) ×2 IMPLANT
TRACKER ENT INSTRUMENT (MISCELLANEOUS) IMPLANT
TRACKER ENT PATIENT (MISCELLANEOUS) IMPLANT
TUBE CONNECTING 20X1/4 (TUBING) ×2 IMPLANT
TUBE SALEM SUMP 16 FR W/ARV (TUBING) IMPLANT
TUBING STRAIGHTSHOT EPS 5PK (TUBING) IMPLANT
YANKAUER SUCT BULB TIP NO VENT (SUCTIONS) IMPLANT

## 2014-10-19 NOTE — Brief Op Note (Signed)
10/19/2014  11:23 AM  PATIENT:  Catherine Stanley  69 y.o. female  PRE-OPERATIVE DIAGNOSIS:  Chronic bilateral ethmoid, maxillary, sphenoid sinusitis and polyposis  POST-OPERATIVE DIAGNOSIS:  Chronic bilateral ethmoid, maxillary, sphenoid sinusitis and polyposis  PROCEDURE:  Procedure(s): 1) Bilateral endoscopic total ethmoidectomy 2) Bilateral endoscopic maxillary antrostomy with polyp removal 3) Bilateral endoscopic sphenoidectomy  SURGEON:  Surgeon(s) and Role:    * Leta Baptist, MD - Primary  PHYSICIAN ASSISTANT:   ASSISTANTS: none   ANESTHESIA:   general  EBL:  Total I/O In: 1500 [I.V.:1500] Out: -   BLOOD ADMINISTERED:none  DRAINS: none   LOCAL MEDICATIONS USED:  LIDOCAINE   SPECIMEN:  Source of Specimen:  Bilateral sinus contents  DISPOSITION OF SPECIMEN:  PATHOLOGY  COUNTS:  YES  TOURNIQUET:  * No tourniquets in log *  DICTATION: .Other Dictation: Dictation Number [id number]  PLAN OF CARE: Discharge to home after PACU  PATIENT DISPOSITION:  PACU - hemodynamically stable.   Delay start of Pharmacological VTE agent (>24hrs) due to surgical blood loss or risk of bleeding: not applicable

## 2014-10-19 NOTE — H&P (Signed)
Cc: Nasal polyposis/chronic sinusitis.  HPI: The patient is a 69 y/o female who returns today for follow up evaluation of recurrent sinusitis. She was last seen 6 weeks ago. At that time, she was noted to have bilateral nasal polyps without acute sinusitis. The patient was treated with daily Flonase and 12 day course of prednisone.  The patient returns today noting improvement in her nasal congestion and post nasal drainage. No facial pain or pressure is currently noted. The patient's sinus CT done in January showed polyps extending into both maxillary sinuses. Involvement of the ethmoid and sphenoid sinuses was also noted.  The patient has a previous history of bilateral nasal polyps and previously underwent septoplasty to treat her deviated septum. No other ENT, GI, or respiratory issue noted since the last visit.   Exam: The nasal cavities were decongested and anesthetised with a combination of oxymetazoline and 4% lidocaine solution.  The flexible scope was inserted into the right nasal cavity.  Endoscopy of the inferior and middle meatus was performed.  Edematous mucosa was noted. Polyps are noted along the middle meatus.   Nasopharynx was clear.  Turbinates were hypertrophied but without mass.  The procedure was repeated on the contralateral side with similar findings.  The patient tolerated the procedure well.  Instructions were given to avoid eating or drinking for 2 hours.    Assessment: 1.  Persistent bilateral nasal polyposis with involvement of both maxillary sinuses along with the sphenoid and left ethmoid.  Plan:  1.  Nasal endoscopy and CT findings are reviewed with the patient. 2.  Treatment options included continuing medical management versus surgical intervention. The patient will likely benefit from endoscopic sinus surgery to clear both maxillary, sphenoid, and ethmoid sinuses. The risks, benefits, details, and alternatives of the procedure are discussed with the patient. Questions  are invited and answered. 3.  The patient would like to proceed with the procedure. This will be scheduled in accordance to the patient's schedule. She will continue with daily Flonase in the interim.

## 2014-10-19 NOTE — Anesthesia Postprocedure Evaluation (Signed)
  Anesthesia Post-op Note  Patient: Catherine Stanley  Procedure(s) Performed: Procedure(s): BILATERAL ENDOSCOPIC TOTAL ETHMOIDECTOMY, BILATERAL ENDOSCOPIC MAXILLARY ANTHROSTOMY BILATERAL ENDOSCOPIC  SPHENOIDECTOMY  (Bilateral)  Patient Location: PACU  Anesthesia Type:General  Level of Consciousness: awake  Airway and Oxygen Therapy: Patient Spontanous Breathing  Post-op Pain: mild  Post-op Assessment: Post-op Vital signs reviewed  Post-op Vital Signs: Reviewed  Last Vitals:  Filed Vitals:   10/19/14 1130  BP:   Pulse: 87  Temp:   Resp: 16    Complications: No apparent anesthesia complications

## 2014-10-19 NOTE — Anesthesia Preprocedure Evaluation (Addendum)
Anesthesia Evaluation  Patient identified by MRN, date of birth, ID band Patient awake    Reviewed: Allergy & Precautions, NPO status , Patient's Chart, lab work & pertinent test results  Airway Mallampati: II  TM Distance: >3 FB Neck ROM: Full    Dental   Pulmonary asthma ,  breath sounds clear to auscultation        Cardiovascular hypertension, +CHF Rhythm:Regular Rate:Normal     Neuro/Psych    GI/Hepatic Neg liver ROS, GERD-  ,  Endo/Other  diabetes  Renal/GU negative Renal ROS     Musculoskeletal   Abdominal   Peds  Hematology   Anesthesia Other Findings   Reproductive/Obstetrics                            Anesthesia Physical Anesthesia Plan  ASA: III  Anesthesia Plan: General   Post-op Pain Management:    Induction: Intravenous  Airway Management Planned: Oral ETT  Additional Equipment:   Intra-op Plan:   Post-operative Plan: Extubation in OR  Informed Consent: I have reviewed the patients History and Physical, chart, labs and discussed the procedure including the risks, benefits and alternatives for the proposed anesthesia with the patient or authorized representative who has indicated his/her understanding and acceptance.   Dental advisory given  Plan Discussed with: CRNA and Anesthesiologist  Anesthesia Plan Comments:         Anesthesia Quick Evaluation

## 2014-10-19 NOTE — Discharge Instructions (Addendum)

## 2014-10-19 NOTE — Transfer of Care (Signed)
Immediate Anesthesia Transfer of Care Note  Patient: Catherine Stanley  Procedure(s) Performed: Procedure(s): BILATERAL ENDOSCOPIC TOTAL ETHMOIDECTOMY, BILATERAL ENDOSCOPIC MAXILLARY ANTHROSTOMY BILATERAL ENDOSCOPIC  SPHENOIDECTOMY  (Bilateral)  Patient Location: PACU  Anesthesia Type:General  Level of Consciousness: awake  Airway & Oxygen Therapy: Patient Spontanous Breathing and Patient connected to face mask oxygen  Post-op Assessment: Report given to RN and Post -op Vital signs reviewed and stable  Post vital signs: Reviewed and stable  Last Vitals:  Filed Vitals:   10/19/14 0809  BP: 122/74  Pulse: 73  Temp: 36.4 C  Resp: 18    Complications: No apparent anesthesia complications

## 2014-10-19 NOTE — Anesthesia Procedure Notes (Signed)
Procedure Name: Intubation Date/Time: 10/19/2014 9:44 AM Performed by: Lieutenant Diego Pre-anesthesia Checklist: Patient identified, Emergency Drugs available, Suction available and Patient being monitored Patient Re-evaluated:Patient Re-evaluated prior to inductionOxygen Delivery Method: Circle System Utilized Preoxygenation: Pre-oxygenation with 100% oxygen Intubation Type: IV induction Ventilation: Mask ventilation without difficulty Laryngoscope Size: Miller and 1 Grade View: Grade I Tube type: Oral Tube size: 7.0 mm Number of attempts: 1 Airway Equipment and Method: Stylet and Oral airway Placement Confirmation: ETT inserted through vocal cords under direct vision,  positive ETCO2 and breath sounds checked- equal and bilateral Secured at: 22 cm Tube secured with: Tape Dental Injury: Teeth and Oropharynx as per pre-operative assessment

## 2014-10-20 ENCOUNTER — Encounter (HOSPITAL_BASED_OUTPATIENT_CLINIC_OR_DEPARTMENT_OTHER): Payer: Self-pay | Admitting: Otolaryngology

## 2014-10-20 NOTE — Op Note (Signed)
Catherine Stanley, Catherine Stanley NO.:  [id number]  MEDICAL RECORD NO.:  [id number]  LOCATION:                                FACILITY:  [facility]  PHYSICIAN:  Leta Baptist, MD            DATE OF BIRTH:  [birth year]  DATE OF PROCEDURE:  10/19/2014 DATE OF DISCHARGE:  10/19/2014                              OPERATIVE REPORT   SURGEON:  Leta Baptist, MD  PREOPERATIVE DIAGNOSES: 1. Bilateral chronic maxillary, sphenoid, and ethmoid sinusitis. 2. Bilateral sinonasal polyps.  POSTOPERATIVE DIAGNOSES: 1. Bilateral chronic maxillary, sphenoid, and ethmoid sinusitis. 2. Bilateral sinonasal polyps.  PROCEDURE PERFORMED: 1. Bilateral endoscopic total ethmoidectomy. 2. Bilateral endoscopic maxillary antrostomy with polyp removal. 3. Bilateral endoscopic sphenoidectomy.  ANESTHESIA:  General endotracheal tube anesthesia.  COMPLICATIONS:  None.  ESTIMATED BLOOD LOSS:  Less than 100 mL.  INDICATION FOR PROCEDURE:  The patient is a 69 year old female with a history of bilateral recurrent sinusitis and sinonasal polyps.  She was previously treated with multiple courses of antibiotics, topical steroids, and systemic steroids.  She has also been treated with extensive allergy medications.  On examination, she was noted to have persistent bilateral sinonasal polyps.  Her CT showed mucosal thickening or acute changes within the maxillary, ethmoid, and sphenoid sinuses. Based on the above findings, the decision was made for the patient to undergo the above-stated procedures.  The risks, benefits, alternatives, and details of the procedures were discussed with the patient. Questions were invited and answered.  Informed consent was obtained.  DESCRIPTION OF PROCEDURE:  The patient was taken to the operating room and placed supine on the operating table.  General endotracheal tube anesthesia was administered by the anesthesiologist.  The patient was positioned and prepped and draped in a standard  fashion for sinonasal surgery.  Preop IV antibiotic was given.  Pledgets soaked with Afrin were placed in both nasal cavities for vasoconstriction.  The pledgets were subsequently removed.  Using a 0- degree scope, both nasal cavities were examined.  Large polyps were noted within both middle meatus.  Attention was first focused on the left side.  Lidocaine 1% with 1:100,000 epinephrine was infiltrated into the middle turbinate, lateral nasal wall, and polypoid tissue.  The polypoid tissue was then removed using a combination of Blakesley forceps, Tru-Cut forceps, and microdebrider.  A standard uncinectomy procedure was performed.  The maxillary antrum was entered and enlarged with a combination of backbiter, Tru-Cut forceps, and microdebrider. Polypoid tissue was removed from the antrum and from the maxillary sinus.  The anterior and the posterior ethmoid cavities were then entered.  The bony partitions of the ethmoid cavities were taken down.  Polypoid tissue was also removed.  Attention was then switched to the sphenoid sinus.  The entrance into the left sphenoid sinus was carefully enlarged with a combination of microdebrider and Tru-cut forceps.  No significant polypoid tissue was noted within the sphenoid sinus.  The surgical sites were copiously irrigated.  Hemostasis was achieved with a NasoPore packing.  Attention was then focused on the right side.  The same procedures were repeated without any exception.  It should be noted that the patient  has a large polyp within the right maxillary sinus.  All polypoid tissue was removed.  The care of the patient was turned over to the anesthesiologist.  The patient was awakened from anesthesia without difficulty.  She was extubated and transferred to the recovery room in good condition.  OPERATIVE FINDINGS:  Bilateral sinonasal polyps and chronic sinusitis.  SPECIMENS:  Bilateral sinus contents.  FOLLOWUP CARE:  The patient will  be discharged home once she is awake and alert.  She may take Percocet p.r.n. pain and amoxicillin p.o. b.i.d. for 5 days.  The patient will follow up in my office in 1 week.     Leta Baptist, MD     ST/MEDQ  D:  10/19/2014  T:  10/19/2014  Job:  [id number]  cc:   [name], M.D.

## 2014-10-29 ENCOUNTER — Ambulatory Visit (INDEPENDENT_AMBULATORY_CARE_PROVIDER_SITE_OTHER): Payer: PPO | Admitting: Otolaryngology

## 2014-10-29 DIAGNOSIS — J323 Chronic sphenoidal sinusitis: Secondary | ICD-10-CM | POA: Diagnosis not present

## 2014-10-29 DIAGNOSIS — J320 Chronic maxillary sinusitis: Secondary | ICD-10-CM | POA: Diagnosis not present

## 2014-10-29 DIAGNOSIS — J322 Chronic ethmoidal sinusitis: Secondary | ICD-10-CM

## 2014-11-12 ENCOUNTER — Ambulatory Visit (INDEPENDENT_AMBULATORY_CARE_PROVIDER_SITE_OTHER): Payer: PPO | Admitting: Otolaryngology

## 2014-11-12 DIAGNOSIS — J322 Chronic ethmoidal sinusitis: Secondary | ICD-10-CM | POA: Diagnosis not present

## 2014-11-12 DIAGNOSIS — J323 Chronic sphenoidal sinusitis: Secondary | ICD-10-CM | POA: Diagnosis not present

## 2014-11-12 DIAGNOSIS — J320 Chronic maxillary sinusitis: Secondary | ICD-10-CM | POA: Diagnosis not present

## 2014-12-17 ENCOUNTER — Ambulatory Visit (INDEPENDENT_AMBULATORY_CARE_PROVIDER_SITE_OTHER): Payer: PPO | Admitting: Otolaryngology

## 2014-12-17 DIAGNOSIS — J320 Chronic maxillary sinusitis: Secondary | ICD-10-CM

## 2014-12-17 DIAGNOSIS — J322 Chronic ethmoidal sinusitis: Secondary | ICD-10-CM | POA: Diagnosis not present

## 2014-12-17 DIAGNOSIS — J323 Chronic sphenoidal sinusitis: Secondary | ICD-10-CM

## 2015-01-25 ENCOUNTER — Other Ambulatory Visit (HOSPITAL_COMMUNITY): Payer: Self-pay | Admitting: Hematology & Oncology

## 2015-01-25 DIAGNOSIS — Z09 Encounter for follow-up examination after completed treatment for conditions other than malignant neoplasm: Secondary | ICD-10-CM

## 2015-01-25 DIAGNOSIS — N63 Unspecified lump in unspecified breast: Secondary | ICD-10-CM

## 2015-01-28 ENCOUNTER — Other Ambulatory Visit (HOSPITAL_COMMUNITY): Payer: PPO

## 2015-01-29 ENCOUNTER — Encounter (HOSPITAL_COMMUNITY): Payer: Self-pay | Admitting: Oncology

## 2015-01-29 ENCOUNTER — Ambulatory Visit (HOSPITAL_COMMUNITY): Payer: PPO | Admitting: Hematology & Oncology

## 2015-01-29 ENCOUNTER — Ambulatory Visit (HOSPITAL_COMMUNITY): Payer: PPO | Admitting: Oncology

## 2015-01-29 ENCOUNTER — Encounter (HOSPITAL_BASED_OUTPATIENT_CLINIC_OR_DEPARTMENT_OTHER): Payer: PPO

## 2015-01-29 ENCOUNTER — Encounter (HOSPITAL_COMMUNITY): Payer: PPO | Attending: Oncology | Admitting: Oncology

## 2015-01-29 VITALS — BP 131/73 | HR 64 | Temp 98.0°F | Resp 16 | Wt 226.6 lb

## 2015-01-29 DIAGNOSIS — M8580 Other specified disorders of bone density and structure, unspecified site: Secondary | ICD-10-CM | POA: Diagnosis not present

## 2015-01-29 DIAGNOSIS — C50912 Malignant neoplasm of unspecified site of left female breast: Secondary | ICD-10-CM | POA: Diagnosis not present

## 2015-01-29 DIAGNOSIS — D72829 Elevated white blood cell count, unspecified: Secondary | ICD-10-CM | POA: Insufficient documentation

## 2015-01-29 DIAGNOSIS — C50919 Malignant neoplasm of unspecified site of unspecified female breast: Secondary | ICD-10-CM | POA: Insufficient documentation

## 2015-01-29 LAB — CBC WITH DIFFERENTIAL/PLATELET
BASOS ABS: 0 10*3/uL (ref 0.0–0.1)
BASOS PCT: 1 % (ref 0–1)
Eosinophils Absolute: 0.3 10*3/uL (ref 0.0–0.7)
Eosinophils Relative: 4 % (ref 0–5)
HCT: 43.8 % (ref 36.0–46.0)
HEMOGLOBIN: 14.6 g/dL (ref 12.0–15.0)
Lymphocytes Relative: 33 % (ref 12–46)
Lymphs Abs: 2.8 10*3/uL (ref 0.7–4.0)
MCH: 30.9 pg (ref 26.0–34.0)
MCHC: 33.3 g/dL (ref 30.0–36.0)
MCV: 92.6 fL (ref 78.0–100.0)
Monocytes Absolute: 0.5 10*3/uL (ref 0.1–1.0)
Monocytes Relative: 5 % (ref 3–12)
NEUTROS ABS: 4.8 10*3/uL (ref 1.7–7.7)
NEUTROS PCT: 57 % (ref 43–77)
Platelets: 247 10*3/uL (ref 150–400)
RBC: 4.73 MIL/uL (ref 3.87–5.11)
RDW: 13.8 % (ref 11.5–15.5)
WBC: 8.4 10*3/uL (ref 4.0–10.5)

## 2015-01-29 LAB — COMPREHENSIVE METABOLIC PANEL
ALBUMIN: 3.9 g/dL (ref 3.5–5.0)
ALK PHOS: 68 U/L (ref 38–126)
ALT: 20 U/L (ref 14–54)
AST: 22 U/L (ref 15–41)
Anion gap: 5 (ref 5–15)
BUN: 15 mg/dL (ref 6–20)
CALCIUM: 9.2 mg/dL (ref 8.9–10.3)
CO2: 28 mmol/L (ref 22–32)
Chloride: 106 mmol/L (ref 101–111)
Creatinine, Ser: 0.55 mg/dL (ref 0.44–1.00)
GFR calc Af Amer: >60 mL/min (ref >60)
GFR calc non Af Amer: >60 mL/min (ref >60)
Glucose, Bld: 112 mg/dL — ABNORMAL HIGH (ref 65–99)
POTASSIUM: 4 mmol/L (ref 3.5–5.1)
Sodium: 139 mmol/L (ref 135–145)
TOTAL PROTEIN: 6.7 g/dL (ref 6.5–8.1)
Total Bilirubin: 1.1 mg/dL (ref 0.3–1.2)

## 2015-01-29 NOTE — Assessment & Plan Note (Signed)
Will update labs today: CBC diff

## 2015-01-29 NOTE — Progress Notes (Signed)
Catherine Stanley presented for labwork. Labs per MD order drawn via Peripheral Line 23 gauge needle inserted in left AC  Good blood return present. Procedure without incident.  Needle removed intact. Patient tolerated procedure well.

## 2015-01-29 NOTE — Patient Instructions (Signed)
[place] at [facility] Discharge Instructions  RECOMMENDATIONS MADE BY THE CONSULTANT AND ANY TEST RESULTS WILL BE SENT TO YOUR REFERRING PHYSICIAN.  Exam and discussion by Robynn Pane, PA-C Will check labs today and get you scheduled for mammogram later this month and Bone Density. Report any new lumps, bone pain, shortness of breath or other symptoms. Continue your calcium, vitamin D and Fosamax. If any concerns with your test results we will call you.  Follow-up in 6 months.  Thank you for choosing [place] at [facility] to provide your oncology and hematology care.  To afford each patient quality time with our provider, please arrive at least 15 minutes before your scheduled appointment time.    You need to re-schedule your appointment should you arrive 10 or more minutes late.  We strive to give you quality time with our providers, and arriving late affects you and other patients whose appointments are after yours.  Also, if you no show three or more times for appointments you may be dismissed from the clinic at the providers discretion.     Again, thank you for choosing [facility].  Our hope is that these requests will decrease the amount of time that you wait before being seen by our physicians.       _____________________________________________________________  Should you have questions after your visit to [facility], please contact our office at [phone] between the hours of 8:30 a.m. and 4:30 p.m.  Voicemails left after 4:30 p.m. will not be returned until the following business day.  For prescription refill requests, have your pharmacy contact our office.

## 2015-01-29 NOTE — Progress Notes (Signed)
Catherine Cahill, MD  [place] [address]  Primary invasive malignant neoplasm of female breast, unspecified laterality - Plan: CBC with Differential, Comprehensive metabolic panel, DG Bone Density  Osteopenia - Plan: CBC with Differential, Comprehensive metabolic panel, DG Bone Density  Leukocytosis  CURRENT THERAPY: Letrozole beginning in August 2013 in [place].  INTERVAL HISTORY: Catherine Stanley 69 y.o. 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 She was treated with letrozole which was started in August of 2013 with surgery on 09/06/2011.   I personally reviewed and went over laboratory results with the patient.  The results are noted within this dictation.  I personally reviewed and went over laboratory results with the patient.  The results are noted within this dictation.  Mammogram and [place] on 08/18/2014 was BIRADS 3 and it is recommended that she repeat B/L diagnostic mammogram 6 months later.  This repeat imaging is scheduled for 02/23/2015.  Chart reviewed.  She is S/P Bilateral endoscopic total ethmoidectomy, bilateral endoscopic maxillary antrostomy with polyp removal, and bilateral endoscopic sphenoidectomy on 10/19/2014 by Dr. Benjamine Mola.  Oncologically, she denies any complaints and ROS questioning is negative.  Past Medical History  Diagnosis Date  . Breast cancer   . Hypertension   . High cholesterol   . Morton's neuroma   . H/O rhinoplasty   . Invasive lobular carcinoma of left breast 01/15/2014    In [place]: 08/31/2011: MRI guided right breast biopsy consistent with focal lobular carcinoma in situ. 09/06/2011: Bilateral breast lumpectomy with sentinel node biopsy revealing invasive lobular carcinoma in the left breast,  size unknown, negative lymph nodes, Oncotype DX score of 11, ER/PR positive, HER-2/neu not overexpressed.   . Asthma in adult   . Sinus infection   . CHF (congestive heart failure) long ago    only one episode - takes lasix for management  . Diabetes mellitus without complication dx 6/41    metformin    has Invasive lobular carcinoma of left breast; Hypertension; Osteopenia; Gastroesophageal reflux disease; Hyperlipidemia; and Leukocytosis on her problem list.     is allergic to sulfa antibiotics; aspirin; other; and seldane.  [name] does not currently have medications on file.  Past Surgical History  Procedure Laterality Date  . Abdominal hysterectomy    . Total abdominal hysterectomy w/ bilateral salpingoophorectomy    . Rhinoplasty      fx nasal septum  . Breast surgery Bilateral 2012    partial mastect Lt; lumpectomy Rt. no nodes removed  . Foot neuroma surgery Left 2000  . Sinus endo with fusion Bilateral 10/19/2014    Procedure: BILATERAL ENDOSCOPIC TOTAL ETHMOIDECTOMY, BILATERAL ENDOSCOPIC MAXILLARY ANTHROSTOMY BILATERAL ENDOSCOPIC  SPHENOIDECTOMY ;  Surgeon: Leta Baptist, MD;  Location: [facility];  Service: ENT;  Laterality: Bilateral;    Denies any headaches, dizziness, double vision, fevers, chills, night sweats, nausea, vomiting, diarrhea, constipation, chest pain, heart palpitations, shortness of breath, blood in stool, black tarry stool, urinary pain, urinary burning, urinary frequency, hematuria.   PHYSICAL EXAMINATION  ECOG PERFORMANCE STATUS: 0 - Asymptomatic  Filed Vitals:   01/29/15 1014  BP: 131/73  Pulse: 64  Temp: 98 F (36.7 C)  Resp: 16    GENERAL:alert, no distress, well nourished, well developed,  comfortable, cooperative, obese and smiling, unaccompanied. SKIN: skin color, texture, turgor are normal, no rashes or significant lesions HEAD: Normocephalic, No masses, lesions, tenderness or abnormalities EYES: normal, PERRLA, EOMI,  Conjunctiva are pink and non-injected EARS: External ears normal OROPHARYNX:lips, buccal mucosa, and tongue normal and mucous membranes are moist  NECK: supple, no adenopathy, thyroid normal size, non-tender, without nodularity, no stridor, non-tender, trachea midline LYMPH:  no palpable lymphadenopathy, no hepatosplenomegaly BREAST:bilateral post-lumpectomy sites well healed and free of suspicious changes LUNGS: clear to auscultation and percussion HEART: regular rate & rhythm, no murmurs, no gallops, S1 normal and S2 normal ABDOMEN:abdomen soft, non-tender, obese, normal bowel sounds and no masses or organomegaly BACK: Back symmetric, no curvature. EXTREMITIES:less then 2 second capillary refill, no joint deformities, effusion, or inflammation, no skin discoloration, no clubbing, no cyanosis  NEURO: alert & oriented x 3 with fluent speech, no focal motor/sensory deficits, gait normal   LABORATORY DATA: CBC    Component Value Date/Time   WBC 16.3* 07/30/2014 1150   RBC 4.54 07/30/2014 1150   HGB 14.5 07/30/2014 1150   HCT 43.5 07/30/2014 1150   PLT 267 07/30/2014 1150   MCV 95.8 07/30/2014 1150   MCH 31.9 07/30/2014 1150   MCHC 33.3 07/30/2014 1150   RDW 12.4 07/30/2014 1150   LYMPHSABS 2.4 07/30/2014 1150   MONOABS 0.8 07/30/2014 1150   EOSABS 0.0 07/30/2014 1150   BASOSABS 0.0 07/30/2014 1150      Chemistry      Component Value Date/Time   NA 142 10/16/2014 1020   K 4.1 10/16/2014 1020   CL 109 10/16/2014 1020   CO2 23 10/16/2014 1020   BUN 7 10/16/2014 1020   CREATININE 0.56 10/16/2014 1020      Component Value Date/Time   CALCIUM 9.5 10/16/2014 1020   ALKPHOS 84 07/30/2014 1150   AST 32 07/30/2014 1150   ALT 45* 07/30/2014 1150   BILITOT 0.8 07/30/2014 1150       RADIOGRAPHIC STUDIES:   CLINICAL DATA: Six-month followup of probably benign bilateral breast masses. 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 Patient underwent bilateral lumpectomies, left sentinel node biopsy, and radiation therapy.  The patient returns to evaluate areas of nodularity near the lumpectomy sites in the bilateral breasts. The patient does state that she has had areas biopsied since her lumpectomy with benign pathology, however mammograms from 2014 as well as biopsy results could not be obtained. The patient has no breast complaints today.  EXAM: DIGITAL DIAGNOSTIC BILATERAL MAMMOGRAM WITH CAD  COMPARISON: Prior mammograms from 02/17/2014 and 03/29/2012.  ACR Breast Density Category b: There are scattered areas of fibroglandular density.  FINDINGS: Postsurgical changes are again seen in the upper outer bilateral breasts. The oval circumscribed nodules in the upper-outer right breast appear unchanged when compared to the prior exam. A spot compression tangential view of the lumpectomy site in the upper-outer left breast was performed, with the 2 probably benign areas of nodularity along the posterior and superior margin of the lumpectomy site less apparent on today's exam.  Mammographic images were processed with CAD.  Physical examination of the outer right breast demonstrates scar at the approximate 10 to 11 o'clock position. Physical examination of the left breast reveals a postsurgical scar in the far upper outer posterior left breast at the approximate 1 o'clock position.  Targeted ultrasound of the right breast was performed demonstrating a postsurgical scar at  10 o'clock 5 cm from the nipple. The small areas of nodularity seen in the upper-outer right breast in the region of the lumpectomy site are not visualized sonographically.  Targeted ultrasound of the left breast was performed demonstrating a postsurgical scar at 1 o'clock 11-12 cm from the nipple. No suspicious abnormalities are seen in the upper-outer left breast near the surgical scar/lumpectomy  site.  IMPRESSION: Unchanged appearance of probably benign findings in the bilateral breasts, which are felt to be related to postsurgical changes.  RECOMMENDATION: Bilateral diagnostic mammography in 6 months (with spot compression magnification views of the lumpectomy sites) is recommended to demonstrate stability of the probably benign findings in the bilateral breasts.  I have discussed the findings and recommendations with the patient. Results were also provided in writing at the conclusion of the visit. If applicable, a reminder letter will be sent to the patient regarding the next appointment.  BI-RADS CATEGORY 3: Probably benign.   Electronically Signed  By: Everlean Alstrom M.D.  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 She is now on letrozole beginning in August 2013.   Bilateral diagnostic mammogram in March 2016 is reviewed.  Repeat bilateral diagnostic mammogram is scheduled for later this month.  Continue Letrozole daily.    Labs today: CBC diff, CMET  Labs in 6 months: CBC diff, CMET.  Bone density within the next few weeks given osteopenia and AI therapy.  Return in 6 months for follow-up.  Osteopenia On Calcium 1200 mg daily, Vit D 1000 units daily, and Fosamax weekly.  Continue Fosamax.    If bone density worsens with next bone density test, I would recommend/consider Prolia therapy.  Order placed for bone density exam.  Briefly discussed Prolia therapy in the event that bone density is worse.    If bone density is worse, will bring the patient in sooner  than initially planned to discuss this intervention and start treatment sooner, rather than later.  Leukocytosis Will update labs today: CBC diff  THERAPY PLAN:  NCCN guidelines recommends the following surveillance for invasive breast cancer:  A. History and Physical exam every 4-6 months for 5 years and then every 12 months.  B. Mammography every 12 months  C. Women on Tamoxifen: annual gynecologic assessment every 12 months if uterus is present.  D. Women on aromatase inhibitor or who experience ovarian failure secondary to treatment should have monitoring of bone health with a bone mineral density determination at baseline and periodically thereafter.  E. Assess and encourage adherence to adjuvant endocrine therapy.  F. Evidence suggests that active lifestyle and achieving and maintaining an ideal body weight (20-25 BMI) may lead to optimal breast cancer outcomes.   All questions were answered. The patient knows to call the clinic with any problems, questions or concerns. We can certainly see the patient much sooner if necessary.  Patient and plan discussed with Dr. Ancil Linsey and she is in agreement with the aforementioned.   This note is electronically signed by: Robynn Pane 01/29/2015 10:39 AM

## 2015-01-29 NOTE — Assessment & Plan Note (Addendum)
On Calcium 1200 mg daily, Vit D 1000 units daily, and Fosamax weekly.  Continue Fosamax.    If bone density worsens with next bone density test, I would recommend/consider Prolia therapy.  Order placed for bone density exam.  Briefly discussed Prolia therapy in the event that bone density is worse.    If bone density is worse, will bring the patient in sooner than initially planned to discuss this intervention and start treatment sooner, rather than later.

## 2015-01-29 NOTE — Assessment & Plan Note (Addendum)
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 She is now on letrozole beginning in August 2013.   Bilateral diagnostic mammogram in March 2016 is reviewed.  Repeat bilateral diagnostic mammogram is scheduled for later this month.  Continue Letrozole daily.    Labs today: CBC diff, CMET  Labs in 6 months: CBC diff, CMET.  Bone density within the next few weeks given osteopenia and AI therapy.  Return in 6 months for follow-up.

## 2015-02-18 ENCOUNTER — Other Ambulatory Visit (HOSPITAL_COMMUNITY): Payer: PPO

## 2015-02-23 ENCOUNTER — Ambulatory Visit (HOSPITAL_COMMUNITY)
Admission: RE | Admit: 2015-02-23 | Discharge: 2015-02-23 | Disposition: A | Discharge status: Home / Self Care | Payer: PPO | Source: Ambulatory Visit | Attending: Hematology & Oncology | Admitting: Hematology & Oncology

## 2015-02-23 ENCOUNTER — Ambulatory Visit (HOSPITAL_COMMUNITY)
Admission: RE | Admit: 2015-02-23 | Discharge: 2015-02-23 | Disposition: A | Discharge status: Home / Self Care | Payer: PPO | Source: Ambulatory Visit | Attending: Oncology | Admitting: Oncology

## 2015-02-23 ENCOUNTER — Other Ambulatory Visit (HOSPITAL_COMMUNITY): Payer: PPO

## 2015-02-23 DIAGNOSIS — E118 Type 2 diabetes mellitus with unspecified complications: Secondary | ICD-10-CM | POA: Insufficient documentation

## 2015-02-23 DIAGNOSIS — N63 Unspecified lump in unspecified breast: Secondary | ICD-10-CM

## 2015-02-23 DIAGNOSIS — Z794 Long term (current) use of insulin: Secondary | ICD-10-CM | POA: Insufficient documentation

## 2015-02-23 DIAGNOSIS — M8580 Other specified disorders of bone density and structure, unspecified site: Secondary | ICD-10-CM | POA: Insufficient documentation

## 2015-02-23 DIAGNOSIS — M85852 Other specified disorders of bone density and structure, left thigh: Secondary | ICD-10-CM | POA: Diagnosis not present

## 2015-02-23 DIAGNOSIS — Z853 Personal history of malignant neoplasm of breast: Secondary | ICD-10-CM | POA: Insufficient documentation

## 2015-02-23 DIAGNOSIS — Z09 Encounter for follow-up examination after completed treatment for conditions other than malignant neoplasm: Secondary | ICD-10-CM

## 2015-02-23 DIAGNOSIS — Z923 Personal history of irradiation: Secondary | ICD-10-CM | POA: Insufficient documentation

## 2015-02-23 DIAGNOSIS — C50919 Malignant neoplasm of unspecified site of unspecified female breast: Secondary | ICD-10-CM

## 2015-02-23 MED ORDER — POVIDONE-IODINE 10 % EX SOLN
CUTANEOUS | Status: AC
  Filled 2015-02-23: qty 15

## 2015-02-24 ENCOUNTER — Other Ambulatory Visit (HOSPITAL_COMMUNITY): Payer: Self-pay | Admitting: Oncology

## 2015-03-01 ENCOUNTER — Other Ambulatory Visit (HOSPITAL_COMMUNITY): Payer: PPO

## 2015-03-10 ENCOUNTER — Other Ambulatory Visit (HOSPITAL_COMMUNITY): Payer: Self-pay

## 2015-03-15 ENCOUNTER — Other Ambulatory Visit (HOSPITAL_COMMUNITY): Payer: PPO

## 2015-03-15 ENCOUNTER — Ambulatory Visit (HOSPITAL_COMMUNITY): Payer: PPO

## 2015-03-17 ENCOUNTER — Other Ambulatory Visit (HOSPITAL_COMMUNITY): Payer: Self-pay

## 2015-03-17 DIAGNOSIS — C50919 Malignant neoplasm of unspecified site of unspecified female breast: Secondary | ICD-10-CM

## 2015-03-17 DIAGNOSIS — M8580 Other specified disorders of bone density and structure, unspecified site: Secondary | ICD-10-CM

## 2015-03-22 ENCOUNTER — Other Ambulatory Visit (HOSPITAL_COMMUNITY): Payer: PPO

## 2015-03-22 ENCOUNTER — Ambulatory Visit (HOSPITAL_COMMUNITY): Payer: PPO

## 2015-04-15 ENCOUNTER — Encounter (HOSPITAL_BASED_OUTPATIENT_CLINIC_OR_DEPARTMENT_OTHER): Payer: PPO

## 2015-04-15 ENCOUNTER — Encounter (HOSPITAL_COMMUNITY): Payer: PPO | Attending: Oncology

## 2015-04-15 ENCOUNTER — Encounter (HOSPITAL_COMMUNITY): Payer: Self-pay

## 2015-04-15 VITALS — BP 133/67 | HR 69 | Temp 97.7°F | Resp 18

## 2015-04-15 DIAGNOSIS — D72829 Elevated white blood cell count, unspecified: Secondary | ICD-10-CM | POA: Insufficient documentation

## 2015-04-15 DIAGNOSIS — C50919 Malignant neoplasm of unspecified site of unspecified female breast: Secondary | ICD-10-CM | POA: Diagnosis not present

## 2015-04-15 DIAGNOSIS — M8580 Other specified disorders of bone density and structure, unspecified site: Secondary | ICD-10-CM | POA: Insufficient documentation

## 2015-04-15 LAB — COMPREHENSIVE METABOLIC PANEL
ALK PHOS: 76 U/L (ref 38–126)
ALT: 23 U/L (ref 14–54)
AST: 24 U/L (ref 15–41)
Albumin: 3.9 g/dL (ref 3.5–5.0)
Anion gap: 9 (ref 5–15)
BUN: 17 mg/dL (ref 6–20)
CHLORIDE: 105 mmol/L (ref 101–111)
CO2: 28 mmol/L (ref 22–32)
Calcium: 9.6 mg/dL (ref 8.9–10.3)
Creatinine, Ser: 0.61 mg/dL (ref 0.44–1.00)
GFR calc non Af Amer: >60 mL/min (ref >60)
Glucose, Bld: 112 mg/dL — ABNORMAL HIGH (ref 65–99)
Potassium: 4 mmol/L (ref 3.5–5.1)
SODIUM: 142 mmol/L (ref 135–145)
Total Bilirubin: 1.1 mg/dL (ref 0.3–1.2)
Total Protein: 7 g/dL (ref 6.5–8.1)

## 2015-04-15 MED ORDER — DENOSUMAB 60 MG/ML ~~LOC~~ SOLN
60.0000 mg | Freq: Once | SUBCUTANEOUS | Status: AC
  Administered 2015-04-15: 60 mg via SUBCUTANEOUS
  Filled 2015-04-15: qty 1

## 2015-04-15 NOTE — Patient Instructions (Addendum)
[place] at [facility] Discharge Instructions  RECOMMENDATIONS MADE BY THE CONSULTANT AND ANY TEST RESULTS WILL BE SENT TO YOUR REFERRING PHYSICIAN.  Stop taking your fosamax Prolia injection given today Continue taking your calium with vitamin D everyday Please notify us if you have anything done other than a cleaning at your dentist Please let your dentist know that your are currently on prolia Follow up as scheduled Please call the clinic if you have any questions or concerns   Thank you for choosing [place] at [facility] to provide your oncology and hematology care.  To afford each patient quality time with our provider, please arrive at least 15 minutes before your scheduled appointment time.    You need to re-schedule your appointment should you arrive 10 or more minutes late.  We strive to give you quality time with our providers, and arriving late affects you and other patients whose appointments are after yours.  Also, if you no show three or more times for appointments you may be dismissed from the clinic at the providers discretion.     Again, thank you for choosing [facility].  Our hope is that these requests will decrease the amount of time that you wait before being seen by our physicians.       _____________________________________________________________  Should you have questions after your visit to [facility], please contact our office at [phone] between the hours of 8:30 a.m. and 4:30 p.m.  Voicemails left after 4:30 p.m. will not be returned until the following business day.  For prescription refill requests, have your pharmacy contact our office.     Denosumab injection What is this medicine? DENOSUMAB (den oh sue mab) slows bone breakdown. Prolia is used to treat osteoporosis in women after menopause and in men. Delton See is used to prevent bone fractures and other bone problems caused  by cancer bone metastases. Delton See is also used to treat giant cell tumor of the bone. This medicine may be used for other purposes; ask your health care provider or pharmacist if you have questions. What should I tell my health care provider before I take this medicine? They need to know if you have any of these conditions: -dental disease -eczema -infection or history of infections -kidney disease or on dialysis -low blood calcium or vitamin D -malabsorption syndrome -scheduled to have surgery or tooth extraction -taking medicine that contains denosumab -thyroid or parathyroid disease -an unusual reaction to denosumab, other medicines, foods, dyes, or preservatives -pregnant or trying to get pregnant -breast-feeding How should I use this medicine? This medicine is for injection under the skin. It is given by a health care professional in a hospital or clinic setting. If you are getting Prolia, a special MedGuide will be given to you by the pharmacist with each prescription and refill. Be sure to read this information carefully each time. For Prolia, talk to your pediatrician regarding the use of this medicine in children. Special care may be needed. For Delton See, talk to your pediatrician regarding the use of this medicine in children. While this drug may be prescribed for children as young as 13 years for selected conditions, precautions do apply. Overdosage: If you think you have taken too much of this medicine contact a poison control center or emergency room at once. NOTE: This medicine is only for you. Do not share this medicine with others. What if I miss a dose? It is  important not to miss your dose. Call your doctor or health care professional if you are unable to keep an appointment. What may interact with this medicine? Do not take this medicine with any of the following medications: -other medicines containing denosumab This medicine may also interact with the following  medications: -medicines that suppress the immune system -medicines that treat cancer -steroid medicines like prednisone or cortisone This list may not describe all possible interactions. Give your health care provider a list of all the medicines, herbs, non-prescription drugs, or dietary supplements you use. Also tell them if you smoke, drink alcohol, or use illegal drugs. Some items may interact with your medicine. What should I watch for while using this medicine? Visit your doctor or health care professional for regular checks on your progress. Your doctor or health care professional may order blood tests and other tests to see how you are doing. Call your doctor or health care professional if you get a cold or other infection while receiving this medicine. Do not treat yourself. This medicine may decrease your body's ability to fight infection. You should make sure you get enough calcium and vitamin D while you are taking this medicine, unless your doctor tells you not to. Discuss the foods you eat and the vitamins you take with your health care professional. See your dentist regularly. Brush and floss your teeth as directed. Before you have any dental work done, tell your dentist you are receiving this medicine. Do not become pregnant while taking this medicine or for 5 months after stopping it. Women should inform their doctor if they wish to become pregnant or think they might be pregnant. There is a potential for serious side effects to an unborn child. Talk to your health care professional or pharmacist for more information. What side effects may I notice from receiving this medicine? Side effects that you should report to your doctor or health care professional as soon as possible: -allergic reactions like skin rash, itching or hives, swelling of the face, lips, or tongue -breathing problems -chest pain -fast, irregular heartbeat -feeling faint or lightheaded, falls -fever, chills, or any  other sign of infection -muscle spasms, tightening, or twitches -numbness or tingling -skin blisters or bumps, or is dry, peels, or red -slow healing or unexplained pain in the mouth or jaw -unusual bleeding or bruising Side effects that usually do not require medical attention (Report these to your doctor or health care professional if they continue or are bothersome.): -muscle pain -stomach upset, gas This list may not describe all possible side effects. Call your doctor for medical advice about side effects. You may report side effects to FDA at 1-800-FDA-1088. Where should I keep my medicine? This medicine is only given in a clinic, doctor's office, or other health care setting and will not be stored at home. NOTE: This sheet is a summary. It may not cover all possible information. If you have questions about this medicine, talk to your doctor, pharmacist, or health care provider.    2016, Elsevier/Gold Standard. (2011-11-13 12:37:47)

## 2015-04-15 NOTE — Progress Notes (Signed)
Catherine Stanley's reason for visit today is for an injection and labs as scheduled per MD orders.  Labs were drawn prior to administration of ordered medication.  Catherine Stanley also received prolia per MD orders; see [facility] for administration details.  Catherine Stanley tolerated all procedures well and without incident; questions were answered and patient was discharged.

## 2015-04-15 NOTE — Progress Notes (Signed)
Ok to treat per Kirby Crigler PA

## 2015-04-16 NOTE — Progress Notes (Signed)
Labs drawn

## 2015-06-22 ENCOUNTER — Other Ambulatory Visit (HOSPITAL_COMMUNITY): Payer: Self-pay | Admitting: Internal Medicine

## 2015-06-22 ENCOUNTER — Ambulatory Visit (HOSPITAL_COMMUNITY)
Admission: RE | Admit: 2015-06-22 | Discharge: 2015-06-22 | Disposition: A | Discharge status: Home / Self Care | Payer: PPO | Source: Ambulatory Visit | Attending: Internal Medicine | Admitting: Internal Medicine

## 2015-06-22 DIAGNOSIS — M5442 Lumbago with sciatica, left side: Secondary | ICD-10-CM

## 2015-06-22 DIAGNOSIS — M479 Spondylosis, unspecified: Secondary | ICD-10-CM | POA: Insufficient documentation

## 2015-06-22 DIAGNOSIS — M545 Low back pain: Secondary | ICD-10-CM | POA: Diagnosis not present

## 2015-06-22 DIAGNOSIS — M4690 Unspecified inflammatory spondylopathy, site unspecified: Secondary | ICD-10-CM | POA: Insufficient documentation

## 2015-07-28 NOTE — Assessment & Plan Note (Signed)
Osteopenia on September 2016 bone density exam.  Started on Prolia on 04/15/2015, in addition to continuation of Calcium 1200 mg daily and Vit D 1000 units.  Oncology Flowsheet 04/15/2015  denosumab (PROLIA) [place] 60 mg    Next injection is due in May 2017.

## 2015-07-28 NOTE — Assessment & Plan Note (Addendum)
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 She is now on letrozole beginning in August 2013.   Continue Letrozole daily.   She reports that she wants to take it past 5 years and therefore utility of BCI testing is questionable as she is not interested in stopped AI therapy at her 5 year mark.  Labs today: CBC diff, CMET  Labs in May 2017: CBC diff, CMET.  I will sync her lab appointments with her Prolia.  Therefore, no labs in 6 months when she returns for office visit.  Labs in November 2017 with her next Prolia injection: CBC diff, CMET.  Return in 6 months for follow-up.  If all is well, we may want to consider coordinating her appointments better with labs, Prolia injection, and follow-up appointments occurring all in 1 day.  She is not interested in BCI testing as she has decided to continue AI therapy past her 5 year mark not matter its results as long as she tolerates it.  I think this is reasonable at this time.  Will need to continue to monitor her bone density as indicated with AI treatment and continuation past 5 years.
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
## 2015-07-29 ENCOUNTER — Encounter (HOSPITAL_COMMUNITY): Payer: PPO | Attending: Oncology | Admitting: Oncology

## 2015-07-29 ENCOUNTER — Encounter (HOSPITAL_COMMUNITY): Payer: Self-pay | Admitting: Oncology

## 2015-07-29 ENCOUNTER — Encounter (HOSPITAL_COMMUNITY): Payer: PPO

## 2015-07-29 ENCOUNTER — Ambulatory Visit (HOSPITAL_COMMUNITY): Payer: PPO | Admitting: Hematology & Oncology

## 2015-07-29 VITALS — BP 146/73 | HR 16 | Temp 97.9°F | Resp 16 | Wt 229.0 lb

## 2015-07-29 DIAGNOSIS — M8580 Other specified disorders of bone density and structure, unspecified site: Secondary | ICD-10-CM | POA: Diagnosis not present

## 2015-07-29 DIAGNOSIS — Z853 Personal history of malignant neoplasm of breast: Secondary | ICD-10-CM | POA: Diagnosis not present

## 2015-07-29 DIAGNOSIS — I509 Heart failure, unspecified: Secondary | ICD-10-CM | POA: Diagnosis not present

## 2015-07-29 DIAGNOSIS — C50912 Malignant neoplasm of unspecified site of left female breast: Secondary | ICD-10-CM

## 2015-07-29 DIAGNOSIS — Z9889 Other specified postprocedural states: Secondary | ICD-10-CM | POA: Insufficient documentation

## 2015-07-29 DIAGNOSIS — Z90710 Acquired absence of both cervix and uterus: Secondary | ICD-10-CM | POA: Insufficient documentation

## 2015-07-29 DIAGNOSIS — E119 Type 2 diabetes mellitus without complications: Secondary | ICD-10-CM | POA: Insufficient documentation

## 2015-07-29 DIAGNOSIS — I10 Essential (primary) hypertension: Secondary | ICD-10-CM | POA: Insufficient documentation

## 2015-07-29 DIAGNOSIS — J45909 Unspecified asthma, uncomplicated: Secondary | ICD-10-CM | POA: Diagnosis not present

## 2015-07-29 DIAGNOSIS — C50919 Malignant neoplasm of unspecified site of unspecified female breast: Secondary | ICD-10-CM

## 2015-07-29 DIAGNOSIS — D0501 Lobular carcinoma in situ of right breast: Secondary | ICD-10-CM | POA: Diagnosis not present

## 2015-07-29 DIAGNOSIS — Z882 Allergy status to sulfonamides status: Secondary | ICD-10-CM | POA: Insufficient documentation

## 2015-07-29 LAB — CBC WITH DIFFERENTIAL/PLATELET
BASOS ABS: 0 10*3/uL (ref 0.0–0.1)
Basophils Relative: 0 %
Eosinophils Absolute: 0.2 10*3/uL (ref 0.0–0.7)
Eosinophils Relative: 2 %
HEMATOCRIT: 44.4 % (ref 36.0–46.0)
HEMOGLOBIN: 15 g/dL (ref 12.0–15.0)
Lymphocytes Relative: 31 %
Lymphs Abs: 2.7 10*3/uL (ref 0.7–4.0)
MCH: 32.3 pg (ref 26.0–34.0)
MCHC: 33.8 g/dL (ref 30.0–36.0)
MCV: 95.7 fL (ref 78.0–100.0)
MONOS PCT: 7 %
Monocytes Absolute: 0.6 10*3/uL (ref 0.1–1.0)
NEUTROS ABS: 5.4 10*3/uL (ref 1.7–7.7)
NEUTROS PCT: 60 %
Platelets: 203 10*3/uL (ref 150–400)
RBC: 4.64 MIL/uL (ref 3.87–5.11)
RDW: 13.3 % (ref 11.5–15.5)
WBC: 8.9 10*3/uL (ref 4.0–10.5)

## 2015-07-29 LAB — COMPREHENSIVE METABOLIC PANEL
ALK PHOS: 67 U/L (ref 38–126)
ALT: 20 U/L (ref 14–54)
ANION GAP: 8 (ref 5–15)
AST: 22 U/L (ref 15–41)
Albumin: 3.6 g/dL (ref 3.5–5.0)
BILIRUBIN TOTAL: 1.1 mg/dL (ref 0.3–1.2)
BUN: 19 mg/dL (ref 6–20)
CALCIUM: 9 mg/dL (ref 8.9–10.3)
CO2: 28 mmol/L (ref 22–32)
Chloride: 106 mmol/L (ref 101–111)
Creatinine, Ser: 0.56 mg/dL (ref 0.44–1.00)
GFR calc Af Amer: >60 mL/min (ref >60)
GFR calc non Af Amer: >60 mL/min (ref >60)
GLUCOSE: 108 mg/dL — AB (ref 65–99)
POTASSIUM: 4.2 mmol/L (ref 3.5–5.1)
SODIUM: 142 mmol/L (ref 135–145)
TOTAL PROTEIN: 6.9 g/dL (ref 6.5–8.1)

## 2015-07-29 NOTE — Patient Instructions (Signed)
[place] at [facility] Discharge Instructions  RECOMMENDATIONS MADE BY THE CONSULTANT AND ANY TEST RESULTS WILL BE SENT TO YOUR REFERRING PHYSICIAN.  Exam and discussion today with Kirby Crigler, PA. Lab work to be done in May and November 2017.  Prolia injection to be given in May. Mammogram in September. Office visit in 6 months.  Thank you for choosing [place] at [facility] to provide your oncology and hematology care.  To afford each patient quality time with our provider, please arrive at least 15 minutes before your scheduled appointment time.   Beginning January 23rd 2017 lab work for the [facility] will be done in the  Main lab at [facility] on 1st floor. If you have a lab appointment with the [facility] please come in thru the  Main Entrance and check in at the main information desk  You need to re-schedule your appointment should you arrive 10 or more minutes late.  We strive to give you quality time with our providers, and arriving late affects you and other patients whose appointments are after yours.  Also, if you no show three or more times for appointments you may be dismissed from the clinic at the providers discretion.     Again, thank you for choosing [facility].  Our hope is that these requests will decrease the amount of time that you wait before being seen by our physicians.       _____________________________________________________________  Should you have questions after your visit to [facility], please contact our office at [phone] between the hours of 8:30 a.m. and 4:30 p.m.  Voicemails left after 4:30 p.m. will not be returned until the following business day.  For prescription refill requests, have your pharmacy contact our office.         Resources For Cancer Patients and their Caregivers ? American Cancer Society: Can assist with transportation, wigs, general needs,  runs Look Good Feel Better.        (680)215-6627 ? Cancer Care: Provides financial assistance, online support groups, medication/co-pay assistance.  1-800-813-HOPE (334)463-0365) ? [place] Assists [place] cancer patients and their families through emotional , educational and financial support.  580-437-2523 ? [place] DSS Where to apply for food stamps, Medicaid and utility assistance. 707-044-1156 ? RCATS: Transportation to medical appointments. 740-775-2855 ? Social Security Administration: May apply for disability if have a Stage IV cancer. 432-525-9761 818-276-9286 ? [employer], Disability and Transit Services: Assists with nutrition, care and transit needs. ([phone]

## 2015-10-14 ENCOUNTER — Encounter (HOSPITAL_COMMUNITY): Payer: Self-pay

## 2015-10-14 ENCOUNTER — Encounter (HOSPITAL_COMMUNITY): Payer: PPO | Attending: Oncology

## 2015-10-14 ENCOUNTER — Encounter (HOSPITAL_COMMUNITY): Payer: PPO

## 2015-10-14 VITALS — BP 135/80 | HR 77 | Temp 97.9°F | Resp 16

## 2015-10-14 DIAGNOSIS — I10 Essential (primary) hypertension: Secondary | ICD-10-CM | POA: Insufficient documentation

## 2015-10-14 DIAGNOSIS — C50912 Malignant neoplasm of unspecified site of left female breast: Secondary | ICD-10-CM | POA: Diagnosis present

## 2015-10-14 DIAGNOSIS — Z882 Allergy status to sulfonamides status: Secondary | ICD-10-CM | POA: Diagnosis not present

## 2015-10-14 DIAGNOSIS — E119 Type 2 diabetes mellitus without complications: Secondary | ICD-10-CM | POA: Diagnosis not present

## 2015-10-14 DIAGNOSIS — M8580 Other specified disorders of bone density and structure, unspecified site: Secondary | ICD-10-CM | POA: Diagnosis not present

## 2015-10-14 DIAGNOSIS — J45909 Unspecified asthma, uncomplicated: Secondary | ICD-10-CM | POA: Insufficient documentation

## 2015-10-14 DIAGNOSIS — Z9889 Other specified postprocedural states: Secondary | ICD-10-CM | POA: Insufficient documentation

## 2015-10-14 DIAGNOSIS — Z853 Personal history of malignant neoplasm of breast: Secondary | ICD-10-CM | POA: Insufficient documentation

## 2015-10-14 DIAGNOSIS — Z90710 Acquired absence of both cervix and uterus: Secondary | ICD-10-CM | POA: Insufficient documentation

## 2015-10-14 DIAGNOSIS — I509 Heart failure, unspecified: Secondary | ICD-10-CM | POA: Diagnosis not present

## 2015-10-14 LAB — COMPREHENSIVE METABOLIC PANEL
ALT: 24 U/L (ref 14–54)
AST: 27 U/L (ref 15–41)
Albumin: 3.9 g/dL (ref 3.5–5.0)
Alkaline Phosphatase: 74 U/L (ref 38–126)
Anion gap: 9 (ref 5–15)
BILIRUBIN TOTAL: 1.3 mg/dL — AB (ref 0.3–1.2)
BUN: 20 mg/dL (ref 6–20)
CALCIUM: 9.5 mg/dL (ref 8.9–10.3)
CO2: 27 mmol/L (ref 22–32)
CREATININE: 0.74 mg/dL (ref 0.44–1.00)
Chloride: 103 mmol/L (ref 101–111)
GFR calc Af Amer: >60 mL/min (ref >60)
Glucose, Bld: 109 mg/dL — ABNORMAL HIGH (ref 65–99)
POTASSIUM: 4.1 mmol/L (ref 3.5–5.1)
Sodium: 139 mmol/L (ref 135–145)
TOTAL PROTEIN: 7.4 g/dL (ref 6.5–8.1)

## 2015-10-14 LAB — CBC WITH DIFFERENTIAL/PLATELET
BASOS ABS: 0 10*3/uL (ref 0.0–0.1)
Basophils Relative: 1 %
Eosinophils Absolute: 0.2 10*3/uL (ref 0.0–0.7)
Eosinophils Relative: 3 %
HEMATOCRIT: 47.7 % — AB (ref 36.0–46.0)
Hemoglobin: 15.8 g/dL — ABNORMAL HIGH (ref 12.0–15.0)
LYMPHS PCT: 30 %
Lymphs Abs: 2.5 10*3/uL (ref 0.7–4.0)
MCH: 34.1 pg — ABNORMAL HIGH (ref 26.0–34.0)
MCHC: 33.1 g/dL (ref 30.0–36.0)
MCV: 102.8 fL — AB (ref 78.0–100.0)
MONO ABS: 0.6 10*3/uL (ref 0.1–1.0)
Monocytes Relative: 7 %
NEUTROS ABS: 5 10*3/uL (ref 1.7–7.7)
Neutrophils Relative %: 59 %
Platelets: 253 10*3/uL (ref 150–400)
RBC: 4.64 MIL/uL (ref 3.87–5.11)
RDW: 13.9 % (ref 11.5–15.5)
WBC: 8.4 10*3/uL (ref 4.0–10.5)

## 2015-10-14 MED ORDER — DENOSUMAB 60 MG/ML ~~LOC~~ SOLN
60.0000 mg | Freq: Once | SUBCUTANEOUS | Status: AC
  Administered 2015-10-14: 60 mg via SUBCUTANEOUS
  Filled 2015-10-14: qty 1

## 2015-10-14 NOTE — Progress Notes (Signed)
Catherine Stanley presents today for injection per the provider's orders.  Prolia administration without incident; see MAR for injection details.  Patient tolerated procedure well and without incident.  No questions or complaints noted at this time.

## 2015-10-14 NOTE — Patient Instructions (Signed)
[place] at [facility] Discharge Instructions  RECOMMENDATIONS MADE BY THE CONSULTANT AND ANY TEST RESULTS WILL BE SENT TO YOUR REFERRING PHYSICIAN.  Labs and Prolia injection today. Return as scheduled for lab work and office visit. Return as scheduled for injections. Call the clinic should you have any questions or concerns prior to your next visit.   Thank you for choosing [place] at [facility] to provide your oncology and hematology care.  To afford each patient quality time with our provider, please arrive at least 15 minutes before your scheduled appointment time.   Beginning January 23rd 2017 lab work for the [facility] will be done in the  Main lab at [facility] on 1st floor. If you have a lab appointment with the [facility] please come in thru the  Main Entrance and check in at the main information desk  You need to re-schedule your appointment should you arrive 10 or more minutes late.  We strive to give you quality time with our providers, and arriving late affects you and other patients whose appointments are after yours.  Also, if you no show three or more times for appointments you may be dismissed from the clinic at the providers discretion.     Again, thank you for choosing [facility].  Our hope is that these requests will decrease the amount of time that you wait before being seen by our physicians.       _____________________________________________________________  Should you have questions after your visit to [facility], please contact our office at [phone] between the hours of 8:30 a.m. and 4:30 p.m.  Voicemails left after 4:30 p.m. will not be returned until the following business day.  For prescription refill requests, have your pharmacy contact our office.         Resources For Cancer Patients and their Caregivers ? American Cancer Society: Can assist with  transportation, wigs, general needs, runs Look Good Feel Better.        719-546-5739 ? Cancer Care: Provides financial assistance, online support groups, medication/co-pay assistance.  1-800-813-HOPE 757-631-9760) ? [place] Assists [place] cancer patients and their families through emotional , educational and financial support.  (250)297-3744 ? [place] DSS Where to apply for food stamps, Medicaid and utility assistance. 713-648-3588 ? RCATS: Transportation to medical appointments. 614-216-2286 ? Social Security Administration: May apply for disability if have a Stage IV cancer. 740 800 5742 (414) 555-9208 ? [employer], Disability and Transit Services: Assists with nutrition, care and transit needs. [place] Support Programs: @10RELATIVEDAYS @ > Cancer Support Group  2nd Tuesday of the month 1pm-2pm, Journey Room  > Creative Journey  3rd Tuesday of the month 1130am-1pm, Journey Room  > Look Good Feel Better  1st Wednesday of the month 10am-12 noon, Journey Room (Call [place] to register [phone])

## 2016-02-01 ENCOUNTER — Ambulatory Visit (HOSPITAL_COMMUNITY): Payer: PPO | Admitting: Hematology & Oncology

## 2016-02-01 NOTE — Progress Notes (Signed)
This encounter was created in error - please disregard.

## 2016-02-21 ENCOUNTER — Encounter (HOSPITAL_COMMUNITY): Payer: PPO

## 2016-03-28 ENCOUNTER — Telehealth (HOSPITAL_COMMUNITY): Payer: Self-pay | Admitting: Hematology & Oncology

## 2016-03-28 NOTE — Telephone Encounter (Signed)
PC TO PT TO GET NEW INS INFO. PER PT SHE HAS MOVED AND WILL SEEK ONCOLOGY CARE ELSE WHERE

## 2016-04-17 ENCOUNTER — Other Ambulatory Visit (HOSPITAL_COMMUNITY): Payer: PPO

## 2016-04-17 ENCOUNTER — Ambulatory Visit (HOSPITAL_COMMUNITY): Payer: PPO

## 2017-04-28 ENCOUNTER — Other Ambulatory Visit: Payer: Self-pay | Admitting: Nurse Practitioner

## 2021-04-02 ENCOUNTER — Other Ambulatory Visit: Payer: Self-pay | Admitting: Nurse Practitioner
